# Patient Record
Sex: Female | Born: 1990 | Race: White | Hispanic: No | Marital: Single | Smoking: Never smoker
Health system: Southern US, Community
[De-identification: ages and names within clinical notes are randomized; demographics above are authoritative.]

## PROBLEM LIST (undated history)

## (undated) DIAGNOSIS — F32A Depression, unspecified: Secondary | ICD-10-CM

## (undated) DIAGNOSIS — R569 Unspecified convulsions: Secondary | ICD-10-CM

## (undated) DIAGNOSIS — R079 Chest pain, unspecified: Secondary | ICD-10-CM

## (undated) DIAGNOSIS — J45909 Unspecified asthma, uncomplicated: Secondary | ICD-10-CM

## (undated) DIAGNOSIS — F329 Major depressive disorder, single episode, unspecified: Secondary | ICD-10-CM

## (undated) DIAGNOSIS — A1801 Tuberculosis of spine: Secondary | ICD-10-CM

## (undated) HISTORY — DX: Unspecified asthma, uncomplicated: J45.909

## (undated) HISTORY — DX: Chest pain, unspecified: R07.9

## (undated) HISTORY — PX: TONSILLECTOMY: SUR1361

---
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
## 2009-04-25 ENCOUNTER — Ambulatory Visit: Payer: Self-pay | Admitting: Women's Health

## 2009-12-06 ENCOUNTER — Emergency Department (HOSPITAL_COMMUNITY): Admission: EM | Admit: 2009-12-06 | Discharge: 2009-12-07 | Payer: Self-pay | Admitting: Emergency Medicine

## 2010-08-11 ENCOUNTER — Encounter (HOSPITAL_COMMUNITY): Payer: Self-pay

## 2010-08-11 ENCOUNTER — Emergency Department (HOSPITAL_COMMUNITY): Payer: BC Managed Care – PPO

## 2010-08-11 ENCOUNTER — Emergency Department (HOSPITAL_COMMUNITY)
Admission: EM | Admit: 2010-08-11 | Discharge: 2010-08-11 | Disposition: A | Discharge status: Home / Self Care | Payer: BC Managed Care – PPO | Attending: Emergency Medicine | Admitting: Emergency Medicine

## 2010-08-11 DIAGNOSIS — J45901 Unspecified asthma with (acute) exacerbation: Secondary | ICD-10-CM | POA: Insufficient documentation

## 2010-12-02 ENCOUNTER — Emergency Department (HOSPITAL_COMMUNITY): Payer: BC Managed Care – PPO

## 2010-12-02 ENCOUNTER — Emergency Department (HOSPITAL_COMMUNITY)
Admission: EM | Admit: 2010-12-02 | Discharge: 2010-12-02 | Disposition: A | Discharge status: Home / Self Care | Payer: BC Managed Care – PPO | Attending: Emergency Medicine | Admitting: Emergency Medicine

## 2010-12-02 ENCOUNTER — Encounter (HOSPITAL_COMMUNITY): Payer: Self-pay | Admitting: Radiology

## 2010-12-02 DIAGNOSIS — R569 Unspecified convulsions: Secondary | ICD-10-CM | POA: Insufficient documentation

## 2010-12-02 DIAGNOSIS — J45909 Unspecified asthma, uncomplicated: Secondary | ICD-10-CM | POA: Insufficient documentation

## 2010-12-02 DIAGNOSIS — N390 Urinary tract infection, site not specified: Secondary | ICD-10-CM | POA: Insufficient documentation

## 2010-12-02 DIAGNOSIS — R5381 Other malaise: Secondary | ICD-10-CM | POA: Insufficient documentation

## 2010-12-02 DIAGNOSIS — R51 Headache: Secondary | ICD-10-CM | POA: Insufficient documentation

## 2010-12-02 DIAGNOSIS — F29 Unspecified psychosis not due to a substance or known physiological condition: Secondary | ICD-10-CM | POA: Insufficient documentation

## 2010-12-02 DIAGNOSIS — R110 Nausea: Secondary | ICD-10-CM | POA: Insufficient documentation

## 2010-12-02 LAB — CBC
HCT: 35.9 % — ABNORMAL LOW (ref 36.0–46.0)
Hemoglobin: 12.9 g/dL (ref 12.0–15.0)
MCHC: 35.9 g/dL (ref 30.0–36.0)
MCV: 83.7 fL (ref 78.0–100.0)
Platelets: 214 10*3/uL (ref 150–400)
RBC: 4.29 MIL/uL (ref 3.87–5.11)
RDW: 12.2 % (ref 11.5–15.5)
WBC: 5.5 10*3/uL (ref 4.0–10.5)

## 2010-12-02 LAB — COMPREHENSIVE METABOLIC PANEL
ALT: 11 U/L (ref 0–35)
AST: 17 U/L (ref 0–37)
Alkaline Phosphatase: 42 U/L (ref 39–117)
BUN: 10 mg/dL (ref 6–23)
CO2: 29 mEq/L (ref 19–32)
Chloride: 102 mEq/L (ref 96–112)
Creatinine, Ser: 0.83 mg/dL (ref 0.50–1.10)
GFR calc Af Amer: >90 mL/min (ref >90)
GFR calc non Af Amer: >90 mL/min (ref >90)
Glucose, Bld: 111 mg/dL — ABNORMAL HIGH (ref 70–99)
Potassium: 3.4 mEq/L — ABNORMAL LOW (ref 3.5–5.1)
Sodium: 138 mEq/L (ref 135–145)
Total Bilirubin: 0.6 mg/dL (ref 0.3–1.2)
Total Protein: 7.3 g/dL (ref 6.0–8.3)

## 2010-12-02 LAB — URINALYSIS, ROUTINE W REFLEX MICROSCOPIC
Bilirubin Urine: NEGATIVE
Glucose, UA: NEGATIVE mg/dL
Ketones, ur: NEGATIVE mg/dL
Nitrite: NEGATIVE
Specific Gravity, Urine: 1.008 (ref 1.005–1.030)
Urobilinogen, UA: 0.2 mg/dL (ref 0.0–1.0)
pH: 7 (ref 5.0–8.0)

## 2010-12-02 LAB — DIFFERENTIAL
Basophils Absolute: 0 10*3/uL (ref 0.0–0.1)
Basophils Relative: 1 % (ref 0–1)
Eosinophils Absolute: 0.1 10*3/uL (ref 0.0–0.7)
Lymphs Abs: 1.3 10*3/uL (ref 0.7–4.0)
Monocytes Absolute: 0.4 10*3/uL (ref 0.1–1.0)
Monocytes Relative: 7 % (ref 3–12)
Neutrophils Relative %: 67 % (ref 43–77)

## 2010-12-02 LAB — RAPID URINE DRUG SCREEN, HOSP PERFORMED
Amphetamines: NOT DETECTED
Benzodiazepines: NOT DETECTED
Cocaine: NOT DETECTED
Opiates: NOT DETECTED
Tetrahydrocannabinol: NOT DETECTED

## 2010-12-02 LAB — URINE MICROSCOPIC-ADD ON

## 2010-12-03 LAB — URINE CULTURE
Colony Count: 70000
Culture  Setup Time: 201210022016

## 2010-12-04 ENCOUNTER — Emergency Department (HOSPITAL_COMMUNITY)
Admission: EM | Admit: 2010-12-04 | Discharge: 2010-12-04 | Disposition: A | Discharge status: Home / Self Care | Payer: BC Managed Care – PPO | Attending: Emergency Medicine | Admitting: Emergency Medicine

## 2010-12-04 DIAGNOSIS — J45909 Unspecified asthma, uncomplicated: Secondary | ICD-10-CM | POA: Insufficient documentation

## 2010-12-04 DIAGNOSIS — F29 Unspecified psychosis not due to a substance or known physiological condition: Secondary | ICD-10-CM | POA: Insufficient documentation

## 2010-12-04 DIAGNOSIS — R569 Unspecified convulsions: Secondary | ICD-10-CM | POA: Insufficient documentation

## 2010-12-06 ENCOUNTER — Emergency Department (HOSPITAL_COMMUNITY)
Admission: EM | Admit: 2010-12-06 | Discharge: 2010-12-06 | Disposition: A | Discharge status: Home / Self Care | Payer: BC Managed Care – PPO | Attending: Emergency Medicine | Admitting: Emergency Medicine

## 2010-12-06 DIAGNOSIS — R569 Unspecified convulsions: Secondary | ICD-10-CM | POA: Insufficient documentation

## 2010-12-06 DIAGNOSIS — R51 Headache: Secondary | ICD-10-CM | POA: Insufficient documentation

## 2010-12-06 DIAGNOSIS — J45909 Unspecified asthma, uncomplicated: Secondary | ICD-10-CM | POA: Insufficient documentation

## 2010-12-30 ENCOUNTER — Emergency Department (HOSPITAL_COMMUNITY)
Admission: EM | Admit: 2010-12-30 | Discharge: 2010-12-30 | Disposition: A | Discharge status: Home / Self Care | Payer: BC Managed Care – PPO | Attending: Emergency Medicine | Admitting: Emergency Medicine

## 2010-12-30 DIAGNOSIS — Z79899 Other long term (current) drug therapy: Secondary | ICD-10-CM | POA: Insufficient documentation

## 2010-12-30 DIAGNOSIS — J45909 Unspecified asthma, uncomplicated: Secondary | ICD-10-CM | POA: Insufficient documentation

## 2010-12-30 DIAGNOSIS — G40909 Epilepsy, unspecified, not intractable, without status epilepticus: Secondary | ICD-10-CM | POA: Insufficient documentation

## 2010-12-30 DIAGNOSIS — R443 Hallucinations, unspecified: Secondary | ICD-10-CM | POA: Insufficient documentation

## 2011-10-31 ENCOUNTER — Encounter (HOSPITAL_COMMUNITY): Payer: Self-pay | Admitting: Emergency Medicine

## 2011-10-31 ENCOUNTER — Observation Stay (HOSPITAL_COMMUNITY)
Admission: EM | Admit: 2011-10-31 | Discharge: 2011-11-03 | DRG: 065 | Disposition: A | Discharge status: Home / Self Care | Payer: BC Managed Care – PPO | Attending: Internal Medicine | Admitting: Internal Medicine

## 2011-10-31 DIAGNOSIS — G40909 Epilepsy, unspecified, not intractable, without status epilepticus: Secondary | ICD-10-CM | POA: Diagnosis present

## 2011-10-31 DIAGNOSIS — R42 Dizziness and giddiness: Secondary | ICD-10-CM | POA: Diagnosis present

## 2011-10-31 DIAGNOSIS — R82998 Other abnormal findings in urine: Secondary | ICD-10-CM | POA: Insufficient documentation

## 2011-10-31 DIAGNOSIS — R270 Ataxia, unspecified: Secondary | ICD-10-CM

## 2011-10-31 DIAGNOSIS — Z7982 Long term (current) use of aspirin: Secondary | ICD-10-CM | POA: Insufficient documentation

## 2011-10-31 DIAGNOSIS — H8309 Labyrinthitis, unspecified ear: Principal | ICD-10-CM | POA: Diagnosis present

## 2011-10-31 DIAGNOSIS — R112 Nausea with vomiting, unspecified: Secondary | ICD-10-CM | POA: Diagnosis present

## 2011-10-31 DIAGNOSIS — R109 Unspecified abdominal pain: Secondary | ICD-10-CM | POA: Insufficient documentation

## 2011-10-31 DIAGNOSIS — J45909 Unspecified asthma, uncomplicated: Secondary | ICD-10-CM | POA: Diagnosis present

## 2011-10-31 HISTORY — DX: Unspecified convulsions: R56.9

## 2011-10-31 MED ORDER — ONDANSETRON HCL 4 MG/2ML IJ SOLN
INTRAMUSCULAR | Status: AC
  Administered 2011-10-31: 23:00:00
  Filled 2011-10-31: qty 2

## 2011-10-31 NOTE — ED Notes (Signed)
Pr arrived by ems. Pt reports, Abdominal pain for 4 days. nv x 2days. Was seen by pcp and prescribed zofran. Has not been helping. Pt with hx of seizure. Family member reports focal seizure like activity. Pt had syncopal episode lasting a few seconds when ems arrived. Pt. A&o. 4mg  zofran by EMS. 20 G L AC.

## 2011-11-01 ENCOUNTER — Emergency Department (HOSPITAL_COMMUNITY): Payer: BC Managed Care – PPO

## 2011-11-01 ENCOUNTER — Inpatient Hospital Stay (HOSPITAL_COMMUNITY): Payer: BC Managed Care – PPO

## 2011-11-01 ENCOUNTER — Encounter (HOSPITAL_COMMUNITY): Payer: Self-pay | Admitting: Internal Medicine

## 2011-11-01 DIAGNOSIS — H8309 Labyrinthitis, unspecified ear: Secondary | ICD-10-CM

## 2011-11-01 DIAGNOSIS — J45909 Unspecified asthma, uncomplicated: Secondary | ICD-10-CM | POA: Diagnosis present

## 2011-11-01 DIAGNOSIS — G40909 Epilepsy, unspecified, not intractable, without status epilepticus: Secondary | ICD-10-CM | POA: Diagnosis present

## 2011-11-01 DIAGNOSIS — R42 Dizziness and giddiness: Secondary | ICD-10-CM

## 2011-11-01 LAB — CBC WITH DIFFERENTIAL/PLATELET
Basophils Absolute: 0 10*3/uL (ref 0.0–0.1)
Eosinophils Absolute: 0 10*3/uL (ref 0.0–0.7)
Eosinophils Relative: 0 % (ref 0–5)
Lymphocytes Relative: 14 % (ref 12–46)
Lymphs Abs: 0.7 10*3/uL (ref 0.7–4.0)
MCV: 85.9 fL (ref 78.0–100.0)
Neutrophils Relative %: 81 % — ABNORMAL HIGH (ref 43–77)
Platelets: 159 10*3/uL (ref 150–400)
RBC: 3.68 MIL/uL — ABNORMAL LOW (ref 3.87–5.11)
RDW: 12.2 % (ref 11.5–15.5)
WBC: 5.2 10*3/uL (ref 4.0–10.5)

## 2011-11-01 LAB — COMPREHENSIVE METABOLIC PANEL
ALT: 10 U/L (ref 0–35)
Alkaline Phosphatase: 35 U/L — ABNORMAL LOW (ref 39–117)
BUN: 8 mg/dL (ref 6–23)
CO2: 28 mEq/L (ref 19–32)
Chloride: 107 mEq/L (ref 96–112)
GFR calc Af Amer: >90 mL/min (ref >90)
GFR calc non Af Amer: >90 mL/min (ref >90)
Glucose, Bld: 86 mg/dL (ref 70–99)
Potassium: 3.4 mEq/L — ABNORMAL LOW (ref 3.5–5.1)
Sodium: 140 mEq/L (ref 135–145)
Total Bilirubin: 0.3 mg/dL (ref 0.3–1.2)

## 2011-11-01 LAB — URINALYSIS, ROUTINE W REFLEX MICROSCOPIC
Ketones, ur: NEGATIVE mg/dL
Nitrite: NEGATIVE
Protein, ur: NEGATIVE mg/dL
pH: 6 (ref 5.0–8.0)

## 2011-11-01 LAB — CARBAMAZEPINE LEVEL, TOTAL: Carbamazepine Lvl: 1.3 ug/mL — ABNORMAL LOW (ref 4.0–12.0)

## 2011-11-01 LAB — BASIC METABOLIC PANEL
CO2: 24 mEq/L (ref 19–32)
Calcium: 8.3 mg/dL — ABNORMAL LOW (ref 8.4–10.5)
Creatinine, Ser: 0.77 mg/dL (ref 0.50–1.10)
GFR calc non Af Amer: >90 mL/min (ref >90)
Sodium: 137 mEq/L (ref 135–145)

## 2011-11-01 LAB — POCT PREGNANCY, URINE: Preg Test, Ur: NEGATIVE

## 2011-11-01 MED ORDER — OXCARBAZEPINE 300 MG PO TABS
600.0000 mg | ORAL_TABLET | ORAL | Status: AC
  Administered 2011-11-01: 600 mg via ORAL
  Filled 2011-11-01: qty 2

## 2011-11-01 MED ORDER — ACETAMINOPHEN 325 MG PO TABS
650.0000 mg | ORAL_TABLET | Freq: Four times a day (QID) | ORAL | Status: DC | PRN
  Administered 2011-11-02: 650 mg via ORAL
  Filled 2011-11-01: qty 1

## 2011-11-01 MED ORDER — MONTELUKAST SODIUM 10 MG PO TABS
10.0000 mg | ORAL_TABLET | Freq: Every day | ORAL | Status: DC
  Administered 2011-11-01 – 2011-11-02 (×2): 10 mg via ORAL
  Filled 2011-11-01 (×3): qty 1

## 2011-11-01 MED ORDER — OXCARBAZEPINE 300 MG PO TABS
600.0000 mg | ORAL_TABLET | Freq: Every morning | ORAL | Status: DC
  Administered 2011-11-02 – 2011-11-03 (×2): 600 mg via ORAL
  Filled 2011-11-01 (×2): qty 2

## 2011-11-01 MED ORDER — ONDANSETRON HCL 4 MG/2ML IJ SOLN
4.0000 mg | Freq: Four times a day (QID) | INTRAMUSCULAR | Status: DC | PRN
  Administered 2011-11-02: 4 mg via INTRAVENOUS
  Filled 2011-11-01: qty 2

## 2011-11-01 MED ORDER — DIAZEPAM 5 MG PO TABS
5.0000 mg | ORAL_TABLET | Freq: Three times a day (TID) | ORAL | Status: DC
  Administered 2011-11-01 – 2011-11-03 (×6): 5 mg via ORAL
  Filled 2011-11-01 (×6): qty 1

## 2011-11-01 MED ORDER — PROMETHAZINE HCL 25 MG/ML IJ SOLN: 12.5000 mg | Freq: Four times a day (QID) | INTRAMUSCULAR | Status: DC | PRN

## 2011-11-01 MED ORDER — POTASSIUM CHLORIDE CRYS ER 20 MEQ PO TBCR
40.0000 meq | EXTENDED_RELEASE_TABLET | Freq: Once | ORAL | Status: AC
  Administered 2011-11-01: 40 meq via ORAL
  Filled 2011-11-01: qty 2

## 2011-11-01 MED ORDER — ALBUTEROL SULFATE (5 MG/ML) 0.5% IN NEBU: 2.5000 mg | INHALATION_SOLUTION | RESPIRATORY_TRACT | Status: DC | PRN

## 2011-11-01 MED ORDER — ONDANSETRON HCL 4 MG PO TABS: 4.0000 mg | ORAL_TABLET | Freq: Four times a day (QID) | ORAL | Status: DC | PRN

## 2011-11-01 MED ORDER — PROMETHAZINE HCL 25 MG/ML IJ SOLN
25.0000 mg | Freq: Once | INTRAMUSCULAR | Status: AC
  Administered 2011-11-01: 25 mg via INTRAVENOUS
  Filled 2011-11-01: qty 1

## 2011-11-01 MED ORDER — GADOBENATE DIMEGLUMINE 529 MG/ML IV SOLN
10.0000 mL | Freq: Once | INTRAVENOUS | Status: AC | PRN
  Administered 2011-11-01: 10 mL via INTRAVENOUS

## 2011-11-01 MED ORDER — ACETAMINOPHEN 650 MG RE SUPP: 650.0000 mg | Freq: Four times a day (QID) | RECTAL | Status: DC | PRN

## 2011-11-01 MED ORDER — ALBUTEROL SULFATE HFA 108 (90 BASE) MCG/ACT IN AERS
2.0000 | INHALATION_SPRAY | Freq: Four times a day (QID) | RESPIRATORY_TRACT | Status: DC | PRN
  Filled 2011-11-01: qty 6.7

## 2011-11-01 MED ORDER — MECLIZINE HCL 25 MG PO TABS
25.0000 mg | ORAL_TABLET | Freq: Three times a day (TID) | ORAL | Status: DC
  Administered 2011-11-01 – 2011-11-03 (×6): 25 mg via ORAL
  Filled 2011-11-01 (×8): qty 1

## 2011-11-01 MED ORDER — ENOXAPARIN SODIUM 40 MG/0.4ML ~~LOC~~ SOLN
40.0000 mg | SUBCUTANEOUS | Status: DC
  Administered 2011-11-01 – 2011-11-02 (×2): 40 mg via SUBCUTANEOUS
  Filled 2011-11-01 (×3): qty 0.4

## 2011-11-01 MED ORDER — OXCARBAZEPINE 300 MG PO TABS
750.0000 mg | ORAL_TABLET | Freq: Every day | ORAL | Status: DC
  Administered 2011-11-01 – 2011-11-02 (×2): 750 mg via ORAL
  Filled 2011-11-01 (×3): qty 1

## 2011-11-01 MED ORDER — SODIUM CHLORIDE 0.9 % IV SOLN
1000.0000 mL | INTRAVENOUS | Status: DC
  Administered 2011-11-01 (×2): 1000 mL via INTRAVENOUS

## 2011-11-01 MED ORDER — DIPHENHYDRAMINE HCL 50 MG/ML IJ SOLN
25.0000 mg | Freq: Once | INTRAMUSCULAR | Status: AC
  Administered 2011-11-01: 25 mg via INTRAVENOUS
  Filled 2011-11-01 (×2): qty 1

## 2011-11-01 MED ORDER — SODIUM CHLORIDE 0.9 % IV SOLN
INTRAVENOUS | Status: DC
  Administered 2011-11-01: 75 mL via INTRAVENOUS
  Administered 2011-11-02: 17:00:00 via INTRAVENOUS

## 2011-11-01 NOTE — ED Notes (Signed)
Pt returned from CT. Amb to BR without problem. Pt denies problem at this time. Family remains at this time

## 2011-11-01 NOTE — ED Notes (Signed)
Patient transported to MRI 

## 2011-11-01 NOTE — ED Provider Notes (Addendum)
History     CSN: [id number]  Arrival date & time 10/31/11  2215   First MD Initiated Contact with Patient 11/01/11 0133      Chief Complaint  Patient presents with  . Abdominal Pain    (Consider location/radiation/quality/duration/timing/severity/associated sxs/prior treatment) The history is provided by a parent and the patient.   21 year old, female, with a history of epilepsy, and asthma, presents emergency department complaining of headache, nausea, vomiting, ringling in her left ear, as well as abdominal pain.  Her symptoms have been present for the past.  4 days.  Her headache began before.  The abdominal pain, and vomiting.  Since she has had the vomiting.  She has not been able to keep her medications down She denies fevers, chills, cough, or shortness of breath.  She denies urinary tract symptoms.  She had had diarrhea.  A few days ago.  She was seen in another emergency department yesterday for the same symptoms where she had a CAT scan apparently, was normal.  She takes Trileptal for her seizures.  She has not changed the dose in greater than 4 months.  She also takes an over-the-counter antihistamine for her asthma, which she has not changed the dosage on.  She uses albuterol, and Singulair as needed.  The last time.  She used them was 4 days ago.  She states that her nausea and vomiting is worse.  If she opened her eyes.  Presently, she denies any symptoms.  Past Medical History  Diagnosis Date  . Asthma   . Seizures     History reviewed. No pertinent past surgical history.  No family history on file.  History  Substance Use Topics  . Smoking status: Not on file  . Smokeless tobacco: Not on file  . Alcohol Use:     OB History    Grav Para Term Preterm Abortions TAB SAB Ect Mult Living                  Review of Systems  Constitutional: Negative for fever, chills and diaphoresis.  HENT: Negative for neck pain.   Eyes: Positive for visual disturbance.    Respiratory: Negative for cough and shortness of breath.   Cardiovascular: Negative for chest pain.  Gastrointestinal: Positive for nausea, vomiting, abdominal pain and diarrhea.  Genitourinary: Negative for dysuria.  Musculoskeletal: Negative for back pain.  Skin: Negative for rash.  Neurological: Positive for dizziness, seizures and headaches.  Hematological: Does not bruise/bleed easily.  Psychiatric/Behavioral: Negative for confusion.  All other systems reviewed and are negative.    Allergies  Amoxicillin  Home Medications   Current Outpatient Rx  Name Route Sig Dispense Refill  . ALBUTEROL SULFATE (2.5 MG/3ML) 0.083% IN NEBU Nebulization Take 2.5 mg by nebulization every 6 (six) hours as needed. For shortness of breath    . ASPIRIN EC 325 MG PO TBEC Oral Take 325 mg by mouth every 6 (six) hours as needed. For headache    . KLS ALLER-TEC PO Oral Take 1 tablet by mouth daily.    Marland Kitchen OXCARBAZEPINE 300 MG PO TABS Oral Take 600-750 mg by mouth See admin instructions. Takes 2 tablets in the morning, and 2.5 tablets at night.    . ALBUTEROL SULFATE HFA 108 (90 BASE) MCG/ACT IN AERS Inhalation Inhale 2 puffs into the lungs every 6 (six) hours as needed. For asthma symptoms    . MONTELUKAST SODIUM 10 MG PO TABS Oral Take 10 mg by mouth at bedtime.  BP 134/80  Pulse 96  Temp 97 F (36.1 C)  Resp 18  SpO2 98%  Physical Exam  Nursing note and vitals reviewed. Constitutional: She is oriented to person, place, and time. She appears well-developed and well-nourished. No distress.  HENT:  Head: Normocephalic and atraumatic.  Eyes: Conjunctivae and EOM are normal. Pupils are equal, round, and reactive to light.  Neck: Normal range of motion. Neck supple.  Cardiovascular: Normal rate, regular rhythm and intact distal pulses.   No murmur heard. Pulmonary/Chest: Effort normal and breath sounds normal. She has no rales.  Abdominal: Soft. She exhibits no distension. There is  tenderness. There is no rebound and no guarding.       Mild tenderness in the right upper quadrant without peritoneal signs  Musculoskeletal: Normal range of motion. She exhibits no edema.  Neurological: She is alert and oriented to person, place, and time. She has normal strength. No cranial nerve deficit. Coordination normal.       Wide based gait with ataxia and + rhomberg  Skin: Skin is warm and dry.  Psychiatric: She has a normal mood and affect. Thought content normal.    ED Course  Procedures (including critical care time) Headache, nausea, vomiting, and tenderness along with ataxia in a 21 year old, female, with known epilepsy.  Concerns are Trileptal, intoxication versus new brain lesion. I discussed the case with Dr. Thana Farr, the neurologist.  She's suggested performing a Tegretol level before proceeding with further imaging.   Labs Reviewed  SPECIMEN HOLD   No results found.   No diagnosis found.  5:23 AM Spoke with Dr. Thad Ranger again.   Told her CBZ level.  She rec'd MRI since ataxic with low CBZ level.  She said if MRI nl, stop all meds except trileptal and f/u with her neurologist.  MDM  Headache, with nausea, vomiting, and ataxia        Cheri Guppy, MD 11/01/11 1610  Cheri Guppy, MD 11/01/11 [phone]

## 2011-11-01 NOTE — ED Notes (Signed)
Family at bedside. meds given per order. Pt awakens easily. Has flat affect. States she may need her inhaler later today

## 2011-11-01 NOTE — ED Notes (Signed)
Family at bedside.pt resting quielty

## 2011-11-01 NOTE — ED Notes (Signed)
MD to bedside to talk with family. Pt sleeping

## 2011-11-01 NOTE — ED Notes (Signed)
Pt sitting up and eating crackers. Family at bedside. VSS pt denies pain or nausea at present

## 2011-11-01 NOTE — ED Notes (Signed)
Pt in CT.

## 2011-11-01 NOTE — Consult Note (Signed)
Reason for Consult: Vertigo, Nystagmus, Nausea and vomiting Referring Physician:  Dr. Osvaldo Shipper  Charts, medications, labs and images reviewed CC: Vertigo, Nystagmus, Nausea and vomiting  HPI: This is a 21 y/o RHWF with a diagnosis of  Partial Seizures and has been seen and followed by Dr. Tera Helper  (Neurologist)  At [facility]  And her seizures have been controlled on trileptal. According to the patient she has been feeling nauseated, and symptoms of vertigo with tinnitus, but she denies break thru seizures. She is not sure if she had any blank stares. But there is no tongue biting, no episodes of convulsions, no bladder or bowel incontinent No headaches, no neck pain, no fever, no motor or sensory deficits, no malaise.She also complains of fullness in her ears. She has received a dose of Benadryl in the morning and then she received Valium and she mentions her symptoms have resolved significantly. Now she can open her eyes and focus and participate in a conversation.  No hearing deficits, negative for lhermitte sign, negative for nuchal rigidity, no rash, patient is not on OCP. She is not taking any other OTC medications. She has constipation on regular basis, she is not on Aminoglycoside For acne.  Past Medical History  Diagnosis Date  . Asthma   . Seizures     . Depression  History reviewed. No pertinent past surgical history.  Family History  Problem Relation Age of Onset  . Thyroid disease Brother     Social History:  reports that she has never smoked. She does not have any smokeless tobacco history on file. She reports that she drinks alcohol. She reports that she does not use illicit drugs.  Allergies  Allergen Reactions  . Amoxicillin Rash    Medications:  Scheduled:   . diazepam  5 mg Oral Q8H  . diphenhydrAMINE  25 mg Intravenous Once  . enoxaparin (LOVENOX) injection  40 mg Subcutaneous Q24H  . meclizine  25 mg Oral TID  . montelukast  10 mg Oral QHS  .  ondansetron      . OXcarbazepine  600 mg Oral To ER  . OXcarbazepine  600 mg Oral q morning - 10a  . OXcarbazepine  750 mg Oral QHS  . potassium chloride  40 mEq Oral Once  . promethazine  25 mg Intravenous Once    ROS:  All 11 point review of systems were obtained and pertinent ones are mentioned in the HPI  Physical Examination: Blood pressure 129/80, pulse 76, temperature 98.9 F (37.2 C), resp. rate 18, height 5\' 4"  (1.626 m), weight 49.896 kg (110 lb), SpO2 100.00%.  Physical Exam: General: She is hypersomnolence from valium but was able to follow commands HEENT: NO nystagmus, no ptosis, no lid lag, PEERLA, EOMI, no field cuts, no neglect Neck: Supple, no JVD, no Carotid bruit Lungs: CTA Heart: RRR, no murmurs, rubs and or gallops Abdomen: bowel sounds present Skin; nO rash, no vesicles Extremities: No edema   Neurologic Examination Mental Status: Alert, oriented, thought content appropriate.  Speech fluent without evidence of aphasia.  Able to follow 3 step commands without difficulty. Cranial Nerves: II: visual fields grossly normal, pupils equal, round, reactive to light and accommodation III,IV, VI: ptosis not present, extra-ocular motions intact bilaterally V,VII: smile symmetric, facial light touch sensation normal bilaterally VIII: hearing normal bilaterally IX,X: gag reflex present XI: trapezius strength/neck flexion strength normal bilaterally XII: tongue strength normal  Motor: Right : Upper extremity   5/5    Left:  Upper extremity   5/5  Lower extremity   5/5     Lower extremity   5/5 Tone and bulk:normal tone throughout; no atrophy noted Sensory: Pinprick and light touch intact throughout, bilaterally Deep Tendon Reflexes: 2+ and symmetric throughout Plantars: Right: downgoing   Left: downgoing Cerebellar: normal finger-to-nose, normal rapid alternating movements and normal heel-to-shin test Extrapyramidal: negative     Laboratory Studies:     Basic Metabolic Panel:  Lab 11/01/11 7846 11/01/11 0241  NA 140 137  K 3.4* 3.4*  CL 107 105  CO2 28 24  GLUCOSE 86 107*  BUN 8 11  CREATININE 0.88 0.77  CALCIUM 9.0 8.3*  MG -- --  PHOS -- --    Liver Function Tests:  Lab 11/01/11 1229  AST 14  ALT 10  ALKPHOS 35*  BILITOT 0.3  PROT 6.2  ALBUMIN 3.6    Lab 11/01/11 1229  LIPASE 22  AMYLASE --   No results found for this basename: AMMONIA:3 in the last 168 hours  CBC:  Lab 11/01/11 0241  WBC 5.2  NEUTROABS 4.2  HGB 11.0*  HCT 31.6*  MCV 85.9  PLT 159    Cardiac Enzymes: No results found for this basename: CKTOTAL:5,CKMB:5,CKMBINDEX:5,TROPONINI:5 in the last 168 hours  BNP: No components found with this basename: POCBNP:5  CBG: No results found for this basename: GLUCAP:5 in the last 168 hours  Microbiology: Results for orders placed during the hospital encounter of 12/02/10  URINE CULTURE     Status: Normal   Collection Time   12/02/10 11:10 AM      Component Value Range Status Comment   Specimen Description URINE, RANDOM   Final    Special Requests NONE   Final    Culture  Setup Time [id number]   Final    Colony Count 70,000 COLONIES/ML   Final    Culture     Final    Value: Multiple bacterial morphotypes present, none predominant. Suggest appropriate recollection if clinically indicated.   Report Status 12/03/2010 FINAL   Final     Coagulation Studies: No results found for this basename: LABPROT:5,INR:5 in the last 72 hours  Urinalysis:  Lab 11/01/11 0241  COLORURINE YELLOW  LABSPEC 1.025  PHURINE 6.0  GLUCOSEU NEGATIVE  HGBUR SMALL*  BILIRUBINUR NEGATIVE  KETONESUR NEGATIVE  PROTEINUR NEGATIVE  UROBILINOGEN 0.2  NITRITE NEGATIVE  LEUKOCYTESUR TRACE*    Lipid Panel:  No results found for this basename: chol, trig, hdl, cholhdl, vldl, ldlcalc    HgbA1C:  No results found for this basename: HGBA1C    Urine Drug Screen:     Component Value Date/Time   LABOPIA NONE  DETECTED 12/02/2010 1110   COCAINSCRNUR NONE DETECTED 12/02/2010 1110   LABBENZ NONE DETECTED 12/02/2010 1110   AMPHETMU NONE DETECTED 12/02/2010 1110   THCU NONE DETECTED 12/02/2010 1110   LABBARB NONE DETECTED 12/02/2010 1110    Alcohol Level: No results found for this basename: ETH:2 in the last 168 hours  Other results: EKG: normal EKG, normal sinus rhythm, there are no previous tracings available for comparison.  Imaging: Mr Shirlee Latch NG Contrast  11/01/2011  *RADIOLOGY REPORT*  Clinical Data:  Vertigo.  Seizures.  MRA HEAD WITHOUT CONTRAST  Technique:  Angiographic images of the Circle of Willis were obtained using MRA technique without intravenous contrast.  Comparison:  MRI brain 11/01/2011.  Findings:  The internal carotid arteries are within normal limits from high cervical segments through the ICA termini.  The A1  and M1 segments are normal.  The anterior communicating artery is patent. The ACA and MCA branch vessels are within normal limits bilaterally.  The left vertebral artery is the dominant vessel.  The PICA origins are visualized and within normal limits bilaterally.  The basilar artery is normal.  Both posterior cerebral arteries originate from basilar tip.  The PCA branch vessels are unremarkable.  IMPRESSION: Normal MRA circle of Willis.  No significant proximal stenosis, aneurysm, or branch vessel occlusion.  *RADIOLOGY REPORT*  MRA NECK WITHOUT AND WITH CONTRAST  Technique:  Angiographic images of the neck were obtained using MRA technique without and with intravenous contrast.  Carotid stenosis measurements (when applicable) are obtained utilizing NASCET criteria, using the distal internal carotid diameter as the denominator.  Contrast: 10mL MULTIHANCE GADOBENATE DIMEGLUMINE 529 MG/ML IV SOLN  Findings:  The time-of-flight images demonstrate no significant flow disturbance at either carotid bifurcation.  Flow is antegrade in the vertebral arteries bilaterally.  A standard three-vessel  arch configuration is present.  The vertebral arteries originate from the subclavian arteries bilaterally.  The left vertebral artery is dominant.  There is no significant stenosis.  The common carotid arteries and carotid artery bifurcations are within normal limits bilaterally.  The internal carotid arteries are straight and normal bilaterally.  IMPRESSION: Negative MRA of the neck.   Original Report Authenticated By: Jamesetta Orleans. MATTERN, M.D.    Mr Angiogram Neck W Wo Contrast  11/01/2011  *RADIOLOGY REPORT*  Clinical Data:  Vertigo.  Seizures.  MRA HEAD WITHOUT CONTRAST  Technique:  Angiographic images of the Circle of Willis were obtained using MRA technique without intravenous contrast.  Comparison:  MRI brain 11/01/2011.  Findings:  The internal carotid arteries are within normal limits from high cervical segments through the ICA termini.  The A1 and M1 segments are normal.  The anterior communicating artery is patent. The ACA and MCA branch vessels are within normal limits bilaterally.  The left vertebral artery is the dominant vessel.  The PICA origins are visualized and within normal limits bilaterally.  The basilar artery is normal.  Both posterior cerebral arteries originate from basilar tip.  The PCA branch vessels are unremarkable.  IMPRESSION: Normal MRA circle of Willis.  No significant proximal stenosis, aneurysm, or branch vessel occlusion.  *RADIOLOGY REPORT*  MRA NECK WITHOUT AND WITH CONTRAST  Technique:  Angiographic images of the neck were obtained using MRA technique without and with intravenous contrast.  Carotid stenosis measurements (when applicable) are obtained utilizing NASCET criteria, using the distal internal carotid diameter as the denominator.  Contrast: 10mL MULTIHANCE GADOBENATE DIMEGLUMINE 529 MG/ML IV SOLN  Findings:  The time-of-flight images demonstrate no significant flow disturbance at either carotid bifurcation.  Flow is antegrade in the vertebral arteries  bilaterally.  A standard three-vessel arch configuration is present.  The vertebral arteries originate from the subclavian arteries bilaterally.  The left vertebral artery is dominant.  There is no significant stenosis.  The common carotid arteries and carotid artery bifurcations are within normal limits bilaterally.  The internal carotid arteries are straight and normal bilaterally.  IMPRESSION: Negative MRA of the neck.   Original Report Authenticated By: Jamesetta Orleans. MATTERN, M.D.    Mr Brain Wo Contrast  11/01/2011  *RADIOLOGY REPORT*  Clinical Data: Ataxia.  History of epilepsy.  Headache.  Nausea and vomiting.  Ringing in the left ear.  MRI HEAD WITHOUT CONTRAST  Technique:  Multiplanar, multiecho pulse sequences of the brain and surrounding structures were obtained according to  standard protocol without intravenous contrast.  Comparison: CT head without contrast 12/02/2010.  Findings: No acute intracranial abnormalities are present. Specifically, there is no evidence for acute infarct, hemorrhage, mass, hydrocephalus, or significant extra-axial fluid collection. Flow is present in the major intracranial arteries.  The globes and orbits are intact.  The paranasal sinuses and mastoid air cells are clear.  IMPRESSION: Negative MRI of the brain.   Original Report Authenticated By: Jamesetta Orleans. MATTERN, M.D.      Assessment/Plan:  21 y/o WF with history of partial seizures, depression and asthma comes with N/V nystagmus, tinnitus, fullness in the ears, diplopia.   MRI of the brain is negative for any etiology, MRA of the head and neck are negative for vascular disease. Sodium level  is normal, generally hyponatremia is a side effect from trileptal . CBZ levels  Differential diagnosis  1) Labrynthitis 2)  Neuritis 3) BPPV 4) Drug toxicity   Recommendations: 1) benadryl 25 mg IV at bedtime 2) Valium 5 mg daily for  3 days 3) Fluid intake 4) OT consult  Ahmari Duerson V-P Eilleen Kempf., MD.,  Ph.D.,MS 11/01/2011 6:17 PM

## 2011-11-01 NOTE — H&P (Signed)
[name] [name] is an 21 y.o. female.    PCP: She tells me, that she sees a Dr. Orvan Falconer in [place] She sees a Dr. Tera Helper, who is a neurologist at [facility] for her Seizures  Chief Complaint: nausea vomiting, dizziness  HPI: this is a 21 year old, Caucasian female, with a past medical history of seizure disorder, which was diagnosed one year ago. She also has a history of asthma. She presented to the hospital with complaints of abdominal pain, nausea, vomiting, and dizziness, which started on Wednesday. She tells me, that she initially started having nausea and vomiting and on Thursday morning she started having dizziness, which she describes as if the room was spinning around her. She, says she's passed out a few times because of this. She has had some chills but denies any fever. She's never had similar symptoms in the past. Denies any recent medication changes. She's been having some pain in the left ear for the last few days. Also admits to a ringing sensation in the ear. Denies any ear discharge. The dizziness is worse with movement. And that exacerbates her nausea and vomiting. She just finished her menstrual period. She denies being on oral contraceptives. She's not any in any sexual relationship at this time. After receiving multiple medications in the ED, patient is feeling a little better. She is accompanied by her mother, her father, her stepfather and her siblings.  When I was outside the room her stepfather came up to me and told me that the patient tends to develop some medical issues or other when the family goes on vacation without her. Apparently, this has happened many times in the past. Her siblings and parents were out to the beach last week when she had the onset of the symptoms.   Home Medications: Prior to Admission medications   Medication Sig Start Date End Date Taking? Authorizing Provider  albuterol (PROVENTIL) (2.5 MG/3ML) 0.083% nebulizer solution Take 2.5 mg by  nebulization every 6 (six) hours as needed. For shortness of breath   Yes Historical Provider, MD  aspirin EC 325 MG tablet Take 325 mg by mouth every 6 (six) hours as needed. For headache   Yes Historical Provider, MD  Cetirizine HCl (KLS ALLER-TEC PO) Take 1 tablet by mouth daily.   Yes Historical Provider, MD  Oxcarbazepine (TRILEPTAL) 300 MG tablet Take 600-750 mg by mouth See admin instructions. Takes 2 tablets in the morning, and 2.5 tablets at night.   Yes Historical Provider, MD  albuterol (PROVENTIL HFA;VENTOLIN HFA) 108 (90 BASE) MCG/ACT inhaler Inhale 2 puffs into the lungs every 6 (six) hours as needed. For asthma symptoms    Historical Provider, MD  montelukast (SINGULAIR) 10 MG tablet Take 10 mg by mouth at bedtime.    Historical Provider, MD    Allergies:  Allergies  Allergen Reactions  . Amoxicillin Rash    Past Medical History: Past Medical History  Diagnosis Date  . Asthma   . Seizures     Denies any surgeries in the past.  Social History:  reports that she has never smoked. She does not have any smokeless tobacco history on file. She reports that she drinks alcohol. She reports that she does not use illicit drugs.She drinks alcoholic beverages very rarely.  Family History:  Family History  Problem Relation Age of Onset  . Thyroid disease Brother     Review of Systems - History obtained from the patient General ROS: positive for  - chills Psychological ROS: negative  Ophthalmic ROS: negative ENT ROS: as in hpi Allergy and Immunology ROS: negative Hematological and Lymphatic ROS: negative Endocrine ROS: negative Respiratory ROS: no cough, shortness of breath, or wheezing Cardiovascular ROS: no chest pain or dyspnea on exertion Gastrointestinal ROS: no abdominal pain, change in bowel habits, or black or bloody stools Genito-Urinary ROS: no dysuria, trouble voiding, or hematuria Musculoskeletal ROS: negative Neurological ROS: as in hpi Dermatological ROS:  negative  Physical Examination Blood pressure 103/64, pulse 72, temperature 97 F (36.1 C), resp. rate 20, SpO2 98.00%.  General appearance: she is sleepy, but easily arousable, cooperative, appears stated age and no distress Head: Normocephalic, without obvious abnormality, atraumatic Eyes: conjunctivae/corneas clear. PERRL, EOM's intact.  Ears: normal TM's and external ear canals both ears. Some cerumen was noted. Throat: lips, mucosa, and tongue normal; teeth and gums normal Neck: no adenopathy, no carotid bruit, no JVD, supple, symmetrical, trachea midline and thyroid not enlarged, symmetric, no tenderness/mass/nodules Resp: clear to auscultation bilaterally Cardio: regular rate and rhythm, S1, S2 normal, no murmur, click, rub or gallop GI: soft, non-tender; bowel sounds normal; no masses,  no organomegaly Extremities: extremities normal, atraumatic, no cyanosis or edema Pulses: 2+ and symmetric Skin: Skin color, texture, turgor normal. No rashes or lesions Lymph nodes: Cervical, supraclavicular, and axillary nodes normal. Neurologic: oriented x3. No cranial nerve deficits. No unequal motor strength. Basically, no focal deficits are noted.  Laboratory Data: Results for orders placed during the hospital encounter of 10/31/11 (from the past 48 hour(s))  CARBAMAZEPINE LEVEL, TOTAL     Status: Abnormal   Collection Time   11/01/11  2:41 AM      Component Value Range Comment   Carbamazepine Lvl 1.3 (*) 4.0 - 12.0 ug/mL   BASIC METABOLIC PANEL     Status: Abnormal   Collection Time   11/01/11  2:41 AM      Component Value Range Comment   Sodium 137  135 - 145 mEq/L    Potassium 3.4 (*) 3.5 - 5.1 mEq/L    Chloride 105  96 - 112 mEq/L    CO2 24  19 - 32 mEq/L    Glucose, Bld 107 (*) 70 - 99 mg/dL    BUN 11  6 - 23 mg/dL    Creatinine, Ser 1.61  0.50 - 1.10 mg/dL    Calcium 8.3 (*) 8.4 - 10.5 mg/dL    GFR calc non Af Amer >90  >90 mL/min    GFR calc Af Amer >90  >90 mL/min   CBC WITH  DIFFERENTIAL     Status: Abnormal   Collection Time   11/01/11  2:41 AM      Component Value Range Comment   WBC 5.2  4.0 - 10.5 K/uL    RBC 3.68 (*) 3.87 - 5.11 MIL/uL    Hemoglobin 11.0 (*) 12.0 - 15.0 g/dL    HCT 09.6 (*) 04.5 - 46.0 %    MCV 85.9  78.0 - 100.0 fL    MCH 29.9  26.0 - 34.0 pg    MCHC 34.8  30.0 - 36.0 g/dL    RDW 40.9  81.1 - 91.4 %    Platelets 159  150 - 400 K/uL    Neutrophils Relative 81 (*) 43 - 77 %    Neutro Abs 4.2  1.7 - 7.7 K/uL    Lymphocytes Relative 14  12 - 46 %    Lymphs Abs 0.7  0.7 - 4.0 K/uL    Monocytes Relative 4  3 - 12 %    Monocytes Absolute 0.2  0.1 - 1.0 K/uL    Eosinophils Relative 0  0 - 5 %    Eosinophils Absolute 0.0  0.0 - 0.7 K/uL    Basophils Relative 0  0 - 1 %    Basophils Absolute 0.0  0.0 - 0.1 K/uL   URINALYSIS, ROUTINE W REFLEX MICROSCOPIC     Status: Abnormal   Collection Time   11/01/11  2:41 AM      Component Value Range Comment   Color, Urine YELLOW  YELLOW    APPearance CLOUDY (*) CLEAR    Specific Gravity, Urine 1.025  1.005 - 1.030    pH 6.0  5.0 - 8.0    Glucose, UA NEGATIVE  NEGATIVE mg/dL    Hgb urine dipstick SMALL (*) NEGATIVE    Bilirubin Urine NEGATIVE  NEGATIVE    Ketones, ur NEGATIVE  NEGATIVE mg/dL    Protein, ur NEGATIVE  NEGATIVE mg/dL    Urobilinogen, UA 0.2  0.0 - 1.0 mg/dL    Nitrite NEGATIVE  NEGATIVE    Leukocytes, UA TRACE (*) NEGATIVE   URINE MICROSCOPIC-ADD ON     Status: Abnormal   Collection Time   11/01/11  2:41 AM      Component Value Range Comment   Squamous Epithelial / LPF FEW (*) RARE    WBC, UA 3-6  <3 WBC/hpf    RBC / HPF 0-2  <3 RBC/hpf    Bacteria, UA FEW (*) RARE    Urine-Other MUCOUS PRESENT     POCT PREGNANCY, URINE     Status: Normal   Collection Time   11/01/11  4:19 AM      Component Value Range Comment   Preg Test, Ur NEGATIVE  NEGATIVE     Radiology Reports: Mr Brain Wo Contrast  11/01/2011  *RADIOLOGY REPORT*  Clinical Data: Ataxia.  History of epilepsy.  Headache.   Nausea and vomiting.  Ringing in the left ear.  MRI HEAD WITHOUT CONTRAST  Technique:  Multiplanar, multiecho pulse sequences of the brain and surrounding structures were obtained according to standard protocol without intravenous contrast.  Comparison: CT head without contrast 12/02/2010.  Findings: No acute intracranial abnormalities are present. Specifically, there is no evidence for acute infarct, hemorrhage, mass, hydrocephalus, or significant extra-axial fluid collection. Flow is present in the major intracranial arteries.  The globes and orbits are intact.  The paranasal sinuses and mastoid air cells are clear.  IMPRESSION: Negative MRI of the brain.   Original Report Authenticated By: Jamesetta Orleans. MATTERN, M.D.      Assessment/Plan  Active Problems:  Vertigo  Labyrinthitis  Seizure disorder  Asthma   #1 vertigo: It appears, that she may have labyrinthitis. This could explain her symptoms. However, because of her history of seizure, neurologist, was involved. MRI of the brain was done, which was negative. Neurology is recommending MRA of the head and neck as well. We will treat her with meclizine and Valium and Phenergan as needed. PT/OT will be asked to see the patient for vestibular exercises. She'll be given clear liquids for now.  #2 abdominal pain: She did have some right upper quadrant tenderness. We will check LFTs and lipase level. However, it appears, that majority of her symptoms are secondary to her vertigo. Urine pregnancy test was negative.  #3 history of seizure disorder: Continue with the Trileptal. Seizure precautions will be utilized.  #4 history of asthma: Continue with albuterol as needed. Continue  with Singulair.  #5 abnormal, UA: We will check a urine culture. She's afebrile. No treatment for now.  She is a full code.  DVT, prophylaxis will utilize.  Further management decisions will depend on results of further testing and patient's response to  treatment.  Labs will be repeated this morning. Potassium level was low last night. However, we will repeat her labs today and decide how to treat.  [facility]  [facility] Pager [phone]  11/01/2011, 12:36 PM

## 2011-11-02 DIAGNOSIS — R279 Unspecified lack of coordination: Secondary | ICD-10-CM

## 2011-11-02 DIAGNOSIS — R112 Nausea with vomiting, unspecified: Secondary | ICD-10-CM | POA: Diagnosis present

## 2011-11-02 LAB — COMPREHENSIVE METABOLIC PANEL
AST: 14 U/L (ref 0–37)
Albumin: 3.4 g/dL — ABNORMAL LOW (ref 3.5–5.2)
BUN: 6 mg/dL (ref 6–23)
Chloride: 107 mEq/L (ref 96–112)
Creatinine, Ser: 0.85 mg/dL (ref 0.50–1.10)
Potassium: 4.1 mEq/L (ref 3.5–5.1)
Total Bilirubin: 0.3 mg/dL (ref 0.3–1.2)
Total Protein: 5.9 g/dL — ABNORMAL LOW (ref 6.0–8.3)

## 2011-11-02 LAB — CBC
HCT: 32 % — ABNORMAL LOW (ref 36.0–46.0)
MCH: 29.1 pg (ref 26.0–34.0)
MCV: 86.3 fL (ref 78.0–100.0)
RBC: 3.71 MIL/uL — ABNORMAL LOW (ref 3.87–5.11)
WBC: 2.5 10*3/uL — ABNORMAL LOW (ref 4.0–10.5)

## 2011-11-02 MED ORDER — PANTOPRAZOLE SODIUM 40 MG IV SOLR
40.0000 mg | Freq: Every day | INTRAVENOUS | Status: DC
  Administered 2011-11-02: 40 mg via INTRAVENOUS
  Filled 2011-11-02 (×2): qty 40

## 2011-11-02 MED ORDER — DIAZEPAM 5 MG PO TABS: ORAL_TABLET | ORAL | Status: DC

## 2011-11-02 MED ORDER — UNABLE TO FIND: Status: DC

## 2011-11-02 MED ORDER — MECLIZINE HCL 25 MG PO TABS: ORAL_TABLET | ORAL | Status: DC

## 2011-11-02 MED ORDER — PROMETHAZINE HCL 12.5 MG PO TABS: 12.5000 mg | ORAL_TABLET | Freq: Four times a day (QID) | ORAL | Status: DC | PRN

## 2011-11-02 NOTE — Progress Notes (Signed)
PCP: MCNEILL,WENDY, MD  Brief HPI: This is a 21 year old, Caucasian female, with a past medical history of seizure disorder, which was diagnosed one year ago. She also has a history of asthma. She presented to the hospital with complaints of abdominal pain, nausea, vomiting, and dizziness, which started on Wednesday. She mentioned that she initially started having nausea and vomiting and then on Thursday morning she started having dizziness, which she describes as if the room was spinning around her. She says she's passed out a few times because of this. She has had some chills but denied any fever. She's never had similar symptoms in the past. Denied any recent medication changes. She's been having some pain in the left ear for the last few days. Also admits to a ringing sensation in the ear. Denied any ear discharge. The dizziness is worse with movement. And that exacerbates her nausea and vomiting. She just finished her menstrual period. She denied being on oral contraceptives. She's not any in any sexual relationship at this time. After receiving multiple medications in the ED, patient felt better.   Past medical history:  Past Medical History  Diagnosis Date  . Asthma   . Seizures     Consultants: Neurology  Procedures: None  Subjective: Patient feels better compared to yesterday. Still with occasional dizzy spells and nausea. Tolerated breakfast this morning. Got dizzy and nauseas with therapist.  Objective: Vital signs in last 24 hours: Temp:  [98.1 F (36.7 C)-98.6 F (37 C)] 98.4 F (36.9 C) (09/02 1343) Pulse Rate:  [61-93] 93  (09/02 1343) Resp:  [18] 18  (09/02 1343) BP: (111-127)/(69-83) 127/83 mmHg (09/02 1343) SpO2:  [100 %] 100 % (09/02 1343) Weight:  [49.8 kg (109 lb 12.6 oz)-50.395 kg (111 lb 1.6 oz)] 49.8 kg (109 lb 12.6 oz) (09/02 0509) Weight change:  Last BM Date: 11/02/11  Intake/Output from previous day:   Intake/Output this shift: Total I/O In: 480  [P.O.:480] Out: -   General appearance: alert, cooperative, appears stated age and no distress Head: Normocephalic, without obvious abnormality, atraumatic Eyes: conjunctivae/corneas clear. PERRL, EOM's intact. Nystagmus is present Resp: clear to auscultation bilaterally Cardio: regular rate and rhythm, S1, S2 normal, no murmur, click, rub or gallop GI: soft, mildly tender in upper abdomen. bowel sounds normal; no masses,  no organomegaly Extremities: extremities normal, atraumatic, no cyanosis or edema Pulses: 2+ and symmetric Skin: Skin color, texture, turgor normal. No rashes or lesions Lymph nodes: Cervical, supraclavicular, and axillary nodes normal. Neurologic: No focal deficits. Alert and oriented x 3.  Lab Results:  Basename 11/02/11 0535 11/01/11 0241  WBC 2.5* 5.2  HGB 10.8* 11.0*  HCT 32.0* 31.6*  PLT 143* 159   BMET  Basename 11/02/11 0535 11/01/11 1229  NA 142 140  K 4.1 3.4*  CL 107 107  CO2 30 28  GLUCOSE 89 86  BUN 6 8  CREATININE 0.85 0.88  CALCIUM 8.9 9.0  ALT 10 10    Studies/Results: Mr Shirlee Latch Wo Contrast  11/01/2011  *RADIOLOGY REPORT*  Clinical Data:  Vertigo.  Seizures.  MRA HEAD WITHOUT CONTRAST  Technique:  Angiographic images of the Circle of Willis were obtained using MRA technique without intravenous contrast.  Comparison:  MRI brain 11/01/2011.  Findings:  The internal carotid arteries are within normal limits from high cervical segments through the ICA termini.  The A1 and M1 segments are normal.  The anterior communicating artery is patent. The ACA and MCA branch vessels are within  normal limits bilaterally.  The left vertebral artery is the dominant vessel.  The PICA origins are visualized and within normal limits bilaterally.  The basilar artery is normal.  Both posterior cerebral arteries originate from basilar tip.  The PCA branch vessels are unremarkable.  IMPRESSION: Normal MRA circle of Willis.  No significant proximal stenosis, aneurysm,  or branch vessel occlusion.  *RADIOLOGY REPORT*  MRA NECK WITHOUT AND WITH CONTRAST  Technique:  Angiographic images of the neck were obtained using MRA technique without and with intravenous contrast.  Carotid stenosis measurements (when applicable) are obtained utilizing NASCET criteria, using the distal internal carotid diameter as the denominator.  Contrast: 10mL MULTIHANCE GADOBENATE DIMEGLUMINE 529 MG/ML IV SOLN  Findings:  The time-of-flight images demonstrate no significant flow disturbance at either carotid bifurcation.  Flow is antegrade in the vertebral arteries bilaterally.  A standard three-vessel arch configuration is present.  The vertebral arteries originate from the subclavian arteries bilaterally.  The left vertebral artery is dominant.  There is no significant stenosis.  The common carotid arteries and carotid artery bifurcations are within normal limits bilaterally.  The internal carotid arteries are straight and normal bilaterally.  IMPRESSION: Negative MRA of the neck.   Original Report Authenticated By: Jamesetta Orleans. MATTERN, M.D.    Mr Angiogram Neck W Wo Contrast  11/01/2011  *RADIOLOGY REPORT*  Clinical Data:  Vertigo.  Seizures.  MRA HEAD WITHOUT CONTRAST  Technique:  Angiographic images of the Circle of Willis were obtained using MRA technique without intravenous contrast.  Comparison:  MRI brain 11/01/2011.  Findings:  The internal carotid arteries are within normal limits from high cervical segments through the ICA termini.  The A1 and M1 segments are normal.  The anterior communicating artery is patent. The ACA and MCA branch vessels are within normal limits bilaterally.  The left vertebral artery is the dominant vessel.  The PICA origins are visualized and within normal limits bilaterally.  The basilar artery is normal.  Both posterior cerebral arteries originate from basilar tip.  The PCA branch vessels are unremarkable.  IMPRESSION: Normal MRA circle of Willis.  No significant  proximal stenosis, aneurysm, or branch vessel occlusion.  *RADIOLOGY REPORT*  MRA NECK WITHOUT AND WITH CONTRAST  Technique:  Angiographic images of the neck were obtained using MRA technique without and with intravenous contrast.  Carotid stenosis measurements (when applicable) are obtained utilizing NASCET criteria, using the distal internal carotid diameter as the denominator.  Contrast: 10mL MULTIHANCE GADOBENATE DIMEGLUMINE 529 MG/ML IV SOLN  Findings:  The time-of-flight images demonstrate no significant flow disturbance at either carotid bifurcation.  Flow is antegrade in the vertebral arteries bilaterally.  A standard three-vessel arch configuration is present.  The vertebral arteries originate from the subclavian arteries bilaterally.  The left vertebral artery is dominant.  There is no significant stenosis.  The common carotid arteries and carotid artery bifurcations are within normal limits bilaterally.  The internal carotid arteries are straight and normal bilaterally.  IMPRESSION: Negative MRA of the neck.   Original Report Authenticated By: Jamesetta Orleans. MATTERN, M.D.    Mr Brain Wo Contrast  11/01/2011  *RADIOLOGY REPORT*  Clinical Data: Ataxia.  History of epilepsy.  Headache.  Nausea and vomiting.  Ringing in the left ear.  MRI HEAD WITHOUT CONTRAST  Technique:  Multiplanar, multiecho pulse sequences of the brain and surrounding structures were obtained according to standard protocol without intravenous contrast.  Comparison: CT head without contrast 12/02/2010.  Findings: No acute intracranial abnormalities are present.  Specifically, there is no evidence for acute infarct, hemorrhage, mass, hydrocephalus, or significant extra-axial fluid collection. Flow is present in the major intracranial arteries.  The globes and orbits are intact.  The paranasal sinuses and mastoid air cells are clear.  IMPRESSION: Negative MRI of the brain.   Original Report Authenticated By: Jamesetta Orleans. MATTERN, M.D.       Medications:  Scheduled:   . diazepam  5 mg Oral Q8H  . enoxaparin (LOVENOX) injection  40 mg Subcutaneous Q24H  . meclizine  25 mg Oral TID  . montelukast  10 mg Oral QHS  . OXcarbazepine  600 mg Oral q morning - 10a  . OXcarbazepine  750 mg Oral QHS  . pantoprazole (PROTONIX) IV  40 mg Intravenous QHS  . potassium chloride  40 mEq Oral Once   Continuous:   . sodium chloride 75 mL (11/01/11 1438)  . DISCONTD: sodium chloride 1,000 mL (11/01/11 0626)   XBM:[id number], acetaminophen, albuterol, albuterol, ondansetron (ZOFRAN) IV, ondansetron, promethazine  Assessment/Plan:  Active Problems:  Vertigo  Labyrinthitis  Seizure disorder  Asthma    Vertigo Most likely secondary to labyrinthitis. She is feeling better. Continue current treatment for now. Initial plan was to discharge her but she got very symptomatic with OT. We will keep her here for 1 more day. Appreciate neurology input. PT/OT to continue.    Abdominal pain/N/V Likely from vertigo. The pain is probably from multiple episodes of vomiting. Will initiate PPI. LFT and Lipase was normal. Urine pregnancy test was negative.   History of seizure disorder Continue with the Trileptal. Seizure precautions will be utilized.   History of Asthma Continue with albuterol as needed. Continue with Singulair.   Abnormal UA without symptoms Afebrile. We will check a urine culture. No treatment for now.   Code Status She is a full code.   DVT, prophylaxis Enoxaparin  Disposition Will go home on discharge. Possibly 9/3.   LOS: 2 days   [facility]  [facility] Pager [phone] 11/02/2011, 2:51 PM

## 2011-11-02 NOTE — Progress Notes (Addendum)
[facility] PROGRESS NOTE    SUBJECTIVE   States he vertigo has significantly improved.  She feels she may be drifting slightly to the left side when getting up.  She has been seen by Vestibular rehab.  OBJECTIVE   Vital signs in last 24 hours: Temp:  [98.1 F (36.7 C)-98.9 F (37.2 C)] 98.1 F (36.7 C) (09/02 0509) Pulse Rate:  [61-77] 61  (09/02 0509) Resp:  [18-20] 18  (09/02 0509) BP: (103-129)/(60-80) 117/77 mmHg (09/02 0509) SpO2:  [98 %-100 %] 100 % (09/02 0509) Weight:  [49.8 kg (109 lb 12.6 oz)-50.395 kg (111 lb 1.6 oz)] 49.8 kg (109 lb 12.6 oz) (09/02 0509)  Intake/Output from previous day:   Intake/Output this shift:   Nutritional status: Clear Liquid  Past Medical History  Diagnosis Date  . Asthma   . Seizures     Neurologic ROS negative with exception of above   Neurologic Exam:  Mental Status: Alert, oriented X 3.  Speech fluent without evidence of aphasia. Able to follow 3 step commands without difficulty. Mood: upbeat Memory: intact Thought content appropriate Cranial Nerves: II-Visual fields grossly intact. (she showed inconsistent exam) III/IV/VI-Extraocular movements intact.  Pupils reactive bilaterally. Ptosis not present. No nystagmus present.  When asked to look at my finger and then my face her eye movements were slow and hesitant but when someone walked into the room she quickly looked over at them without problems.  V/VII-Smile symmetric VIII-grossly intact IX/X-normal gag XI-bilateral shoulder shrug XII-midline tongue extension Motor: 5/5 bilaterally with normal tone and bulk Sensory: Pinprick and light touch intact throughout, bilaterally Deep Tendon Reflexes:  Right: Upper Extremity   Left: Upper extremity   biceps (C-5 to C-6) 2/4   biceps (C-5 to C-6) 2/4 tricep (C7) 2/4    triceps (C7) 2/4 Brachioradialis (C6) 2/4  Brachioradialis (C6) 2/4  Lower Extremity Lower Extremity    quadriceps (L-2 to L-4) 2/4   quadriceps (L-2 to L-4) 2/4 Achilles (S1) 2/4   Achilles (S1) 2/4      Plantars:      Right:  downgoing     Left:  Downgoing Cerebellar: Normal finger-to-nose, normal rapid alternating movements and normal heel-to-shin test.   Gait: Normal gait and station.   Lab Results: Results for orders placed during the hospital encounter of 10/31/11 (from the past 24 hour(s))  COMPREHENSIVE METABOLIC PANEL     Status: Abnormal   Collection Time   11/01/11 12:29 PM      Component Value Range   Sodium 140  135 - 145 mEq/L   Potassium 3.4 (*) 3.5 - 5.1 mEq/L   Chloride 107  96 - 112 mEq/L   CO2 28  19 - 32 mEq/L   Glucose, Bld 86  70 - 99 mg/dL   BUN 8  6 - 23 mg/dL   Creatinine, Ser 1.61  0.50 - 1.10 mg/dL   Calcium 9.0  8.4 - 09.6 mg/dL   Total Protein 6.2  6.0 - 8.3 g/dL   Albumin 3.6  3.5 - 5.2 g/dL   AST 14  0 - 37 U/L   ALT 10  0 - 35 U/L   Alkaline Phosphatase 35 (*) 39 - 117 U/L   Total Bilirubin 0.3  0.3 - 1.2 mg/dL  GFR calc non Af Amer >90  >90 mL/min   GFR calc Af Amer >90  >90 mL/min  LIPASE, BLOOD     Status: Normal   Collection Time   11/01/11 12:29 PM      Component Value Range   Lipase 22  11 - 59 U/L  SALICYLATE LEVEL     Status: Normal   Collection Time   11/01/11 12:29 PM      Component Value Range   Salicylate Lvl 6.2  2.8 - 20.0 mg/dL  COMPREHENSIVE METABOLIC PANEL     Status: Abnormal   Collection Time   11/02/11  5:35 AM      Component Value Range   Sodium 142  135 - 145 mEq/L   Potassium 4.1  3.5 - 5.1 mEq/L   Chloride 107  96 - 112 mEq/L   CO2 30  19 - 32 mEq/L   Glucose, Bld 89  70 - 99 mg/dL   BUN 6  6 - 23 mg/dL   Creatinine, Ser 4.09  0.50 - 1.10 mg/dL   Calcium 8.9  8.4 - 81.1 mg/dL   Total Protein 5.9 (*) 6.0 - 8.3 g/dL   Albumin 3.4 (*) 3.5 - 5.2 g/dL   AST 14  0 - 37 U/L   ALT 10  0 - 35 U/L   Alkaline Phosphatase 35 (*) 39 - 117 U/L   Total Bilirubin 0.3  0.3 - 1.2 mg/dL   GFR calc non Af Amer >90  >90 mL/min    GFR calc Af Amer >90  >90 mL/min  CBC     Status: Abnormal   Collection Time   11/02/11  5:35 AM      Component Value Range   WBC 2.5 (*) 4.0 - 10.5 K/uL   RBC 3.71 (*) 3.87 - 5.11 MIL/uL   Hemoglobin 10.8 (*) 12.0 - 15.0 g/dL   HCT 91.4 (*) 78.2 - 95.6 %   MCV 86.3  78.0 - 100.0 fL   MCH 29.1  26.0 - 34.0 pg   MCHC 33.8  30.0 - 36.0 g/dL   RDW 21.3  08.6 - 57.8 %   Platelets 143 (*) 150 - 400 K/uL   Lipid Panel No results found for this basename: CHOL,TRIG,HDL,CHOLHDL,VLDL,LDLCALC in the last 72 hours  Studies/Results: Mr Shirlee Latch Wo Contrast  11/01/2011  *RADIOLOGY REPORT*  Clinical Data:  Vertigo.  Seizures.  MRA HEAD WITHOUT CONTRAST  Technique:  Angiographic images of the Circle of Willis were obtained using MRA technique without intravenous contrast.  Comparison:  MRI brain 11/01/2011.  Findings:  The internal carotid arteries are within normal limits from high cervical segments through the ICA termini.  The A1 and M1 segments are normal.  The anterior communicating artery is patent. The ACA and MCA branch vessels are within normal limits bilaterally.  The left vertebral artery is the dominant vessel.  The PICA origins are visualized and within normal limits bilaterally.  The basilar artery is normal.  Both posterior cerebral arteries originate from basilar tip.  The PCA branch vessels are unremarkable.  IMPRESSION: Normal MRA circle of Willis.  No significant proximal stenosis, aneurysm, or branch vessel occlusion.  *RADIOLOGY REPORT*  MRA NECK WITHOUT AND WITH CONTRAST  Technique:  Angiographic images of the neck were obtained using MRA technique without and with intravenous contrast.  Carotid stenosis measurements (when applicable) are obtained utilizing NASCET criteria, using the distal internal carotid diameter as the denominator.  Contrast: 10mL MULTIHANCE GADOBENATE DIMEGLUMINE  529 MG/ML IV SOLN  Findings:  The time-of-flight images demonstrate no significant flow disturbance at either  carotid bifurcation.  Flow is antegrade in the vertebral arteries bilaterally.  A standard three-vessel arch configuration is present.  The vertebral arteries originate from the subclavian arteries bilaterally.  The left vertebral artery is dominant.  There is no significant stenosis.  The common carotid arteries and carotid artery bifurcations are within normal limits bilaterally.  The internal carotid arteries are straight and normal bilaterally.  IMPRESSION: Negative MRA of the neck.   Original Report Authenticated By: Jamesetta Orleans. MATTERN, M.D.    Mr Angiogram Neck W Wo Contrast  11/01/2011  *RADIOLOGY REPORT*  Clinical Data:  Vertigo.  Seizures.  MRA HEAD WITHOUT CONTRAST  Technique:  Angiographic images of the Circle of Willis were obtained using MRA technique without intravenous contrast.  Comparison:  MRI brain 11/01/2011.  Findings:  The internal carotid arteries are within normal limits from high cervical segments through the ICA termini.  The A1 and M1 segments are normal.  The anterior communicating artery is patent. The ACA and MCA branch vessels are within normal limits bilaterally.  The left vertebral artery is the dominant vessel.  The PICA origins are visualized and within normal limits bilaterally.  The basilar artery is normal.  Both posterior cerebral arteries originate from basilar tip.  The PCA branch vessels are unremarkable.  IMPRESSION: Normal MRA circle of Willis.  No significant proximal stenosis, aneurysm, or branch vessel occlusion.  *RADIOLOGY REPORT*  MRA NECK WITHOUT AND WITH CONTRAST  Technique:  Angiographic images of the neck were obtained using MRA technique without and with intravenous contrast.  Carotid stenosis measurements (when applicable) are obtained utilizing NASCET criteria, using the distal internal carotid diameter as the denominator.  Contrast: 10mL MULTIHANCE GADOBENATE DIMEGLUMINE 529 MG/ML IV SOLN  Findings:  The time-of-flight images demonstrate no significant  flow disturbance at either carotid bifurcation.  Flow is antegrade in the vertebral arteries bilaterally.  A standard three-vessel arch configuration is present.  The vertebral arteries originate from the subclavian arteries bilaterally.  The left vertebral artery is dominant.  There is no significant stenosis.  The common carotid arteries and carotid artery bifurcations are within normal limits bilaterally.  The internal carotid arteries are straight and normal bilaterally.  IMPRESSION: Negative MRA of the neck.   Original Report Authenticated By: Jamesetta Orleans. MATTERN, M.D.    Mr Brain Wo Contrast  11/01/2011  *RADIOLOGY REPORT*  Clinical Data: Ataxia.  History of epilepsy.  Headache.  Nausea and vomiting.  Ringing in the left ear.  MRI HEAD WITHOUT CONTRAST  Technique:  Multiplanar, multiecho pulse sequences of the brain and surrounding structures were obtained according to standard protocol without intravenous contrast.  Comparison: CT head without contrast 12/02/2010.  Findings: No acute intracranial abnormalities are present. Specifically, there is no evidence for acute infarct, hemorrhage, mass, hydrocephalus, or significant extra-axial fluid collection. Flow is present in the major intracranial arteries.  The globes and orbits are intact.  The paranasal sinuses and mastoid air cells are clear.  IMPRESSION: Negative MRI of the brain.   Original Report Authenticated By: Jamesetta Orleans. MATTERN, M.D.     Medications:     Prior to Admission:  Prescriptions prior to admission  Medication Sig Dispense Refill  . albuterol (PROVENTIL) (2.5 MG/3ML) 0.083% nebulizer solution Take 2.5 mg by nebulization every 6 (six) hours as needed. For shortness of breath      . aspirin EC 325 MG tablet Take  325 mg by mouth every 6 (six) hours as needed. For headache      . Cetirizine HCl (KLS ALLER-TEC PO) Take 1 tablet by mouth daily.      . Oxcarbazepine (TRILEPTAL) 300 MG tablet Take 600-750 mg by mouth See admin  instructions. Takes 2 tablets in the morning, and 2.5 tablets at night.      Marland Kitchen albuterol (PROVENTIL HFA;VENTOLIN HFA) 108 (90 BASE) MCG/ACT inhaler Inhale 2 puffs into the lungs every 6 (six) hours as needed. For asthma symptoms      . montelukast (SINGULAIR) 10 MG tablet Take 10 mg by mouth at bedtime.       Scheduled:   . diazepam  5 mg Oral Q8H  . diphenhydrAMINE  25 mg Intravenous Once  . enoxaparin (LOVENOX) injection  40 mg Subcutaneous Q24H  . meclizine  25 mg Oral TID  . montelukast  10 mg Oral QHS  . OXcarbazepine  600 mg Oral To ER  . OXcarbazepine  600 mg Oral q morning - 10a  . OXcarbazepine  750 mg Oral QHS  . potassium chloride  40 mEq Oral Once    Assessment/Plan:  21 y/o WF with history of partial seizures, depression and asthma comes with N/V nystagmus, tinnitus, fullness in the ears, diplopia. MRI of the brain is negative for any etiology, MRA of the head and neck are negative for vascular disease Exam shows mild inconsistency with eye movements and visual fields.   Patient feels significantly improved on medications.   Patient Active Hospital Problem List: Vertigo (11/01/2011)/ Labyrinthitis (11/01/2011)   Assessment: symptoms have improved with valium and meclizine.    Plan: Vestibular rehab. continue symptomatic treatment    No further treatment recommendations.   Neurology will sign off.    Discussed with Dr. Lethea Killings PA-C [facility] [phone]  11/02/2011, 10:00 AM      Reviewed MRI and MRA of the head and neck. Reviewed labs and images. Please d/C Valium after 2 days.  It has high risk of addiction Will sign off. If needed please do not hesitate to call Thank you  Amayia Ciano V-P Eilleen Kempf., MD., Ph.D.,MS 11/02/2011 10:46 AM

## 2011-11-02 NOTE — Progress Notes (Signed)
   CARE MANAGEMENT NOTE 11/02/2011  Patient:  [name],[name]   Account Number:  [id number]  Date Initiated:  11/02/2011  Documentation initiated by:  [name] [name]  Subjective/Objective Assessment:   Patient admitted with vertigo     Action/Plan:   Progression of care and discharge planning   Anticipated DC Date:     Anticipated DC Plan:        DC Planning Services  CM consult      Choice offered to / List presented to:     DME arranged  [name] [name]      DME agency  [facility]        Status of service:  In process, will continue to follow Medicare Important Message given?   (If response is "NO", the following Medicare IM given date fields will be blank) Date Medicare IM given:   Date Additional Medicare IM given:    Discharge Disposition:    Per UR Regulation:    If discussed at Long Length of Stay Meetings, dates discussed:    Comments:  11/02/2011  [address] RN, [place]  [phone] [facility]/[name] [name] with referral for RW.

## 2011-11-02 NOTE — Evaluation (Addendum)
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  Initiated x1 exercises in which patient performed well overall.  Patient will neeed to use a RW initially and have Outpt. PT f/u for vestibular rehab.  MD: Please give patient a prescription for the Outpt PT for vestibular Rehab.  Thanks.  Patient reports that she wants to go home with roommate.  Will have intermittent supervision.  Would like to have one more treatment with her in the am if possible prior to d/c home unless patient gets more comfortable walking with RW with nursing today.      PT Assessment  Patient needs continued PT services    Follow Up Recommendations  Outpatient PT;Supervision - Intermittent    Barriers to Discharge        Equipment Recommendations  Rolling walker with 5" wheels    Recommendations for Other Services     Frequency Min 3X/week    Precautions / Restrictions Precautions Precautions: Fall Restrictions Weight Bearing Restrictions: No   Pertinent Vitals/Pain VSS, No pain      Mobility  Bed Mobility Bed Mobility: Supine to Sit Supine to Sit: 7: Independent Details for Bed Mobility Assistance: Performed vestibular testing while patient sitting on EOB.  - Hallpike and supine head roll test.  + head thrust to left, suggesting left vestibular hypofunction.  Initiated x1 gaze stabillity exercises.  Patient able to perform exercise side to side 20 seconds with 6/10 dizziness, 57 seconds with 7/10 dizziness.  Encouraged to perform 3 reps side to side and 1 rep up and down 5 x daily in sitting.  Then PT will progress to standing when patient is ready.   Transfers Transfers: Sit to Stand;Stand to Sit Sit to Stand: 4: Min guard;With upper extremity  assist;From bed Stand to Sit: 4: Min guard;With upper extremity assist;To bed Ambulation/Gait Ambulation/Gait Assistance: 4: Min assist Ambulation Distance (Feet): 350 Feet Assistive device: Rolling walker Ambulation/Gait Assistance Details: Placed target "A" on IV pole.  Patient ambulated using target "A" on IV pole with better gait than without target.  Without target, patient loses balance to left.  Needs to use RW with target "A" for home.  Pt. agrees.  Also suggested other compensatory strategies for patient to turn eyes, then head then body.  Pt. can demonstrate strategies.   Gait Pattern: Step-through pattern;Decreased stride length;Ataxic Gait velocity: decreased General Gait Details: PAtient had somewhat ataxic gait.  Needed steadying assist throughout ambulation.  Patient reports that targets make a difference in her ability to ambulate more safely and that prior to using targets she would "lean to left greatly".   Stairs: No Wheelchair Mobility Wheelchair Mobility: No         PT Diagnosis: Generalized weakness (dizziness)  PT Problem List: Decreased activity tolerance;Decreased balance;Decreased mobility;Decreased safety awareness;Decreased knowledge of use of DME PT Treatment Interventions: Gait training;DME instruction;Functional mobility training;Therapeutic activities;Therapeutic exercise;Balance training;Patient/family education   PT Goals Acute Rehab PT Goals PT Goal Formulation: With patient Time For Goal Achievement: 11/09/11 Potential to Achieve Goals: Good Pt will go Supine/Side to Sit: Independently PT Goal: Supine/Side to Sit - Progress: Goal set today Pt will go Sit to Stand: Independently PT Goal: Sit to Stand - Progress: Goal set today Pt will Stand: Independently;3 - 5 min;with no upper extremity support PT Goal:  Stand - Progress: Goal set today Pt will Ambulate: 51 - 150 feet;with modified independence;with least restrictive assistive device PT Goal:  Ambulate - Progress: Goal set today Pt will Perform Home Exercise Program: Independently PT Goal: Perform Home Exercise Program - Progress: Goal set today Additional Goals Additional Goal #1: PAtient to utilize compensatory strategies to assist with decreasing dizziness with movements to <3/10.   PT Goal: Additional Goal #1 - Progress: Goal set today  Visit Information  Last PT Received On: 11/02/11 Assistance Needed: +1    Subjective Data  Subjective: "I am feeling better everyday." Patient Stated Goal: To go home   Prior Functioning  Home Living Lives With: Friend(s) Available Help at Discharge: Friend(s);Available PRN/intermittently Type of Home: House Home Access: Stairs to enter [employer] of Steps: few Home Layout: One level Firefighter: Standard Home Adaptive Equipment: None Prior Function Level of Independence: Independent Able to Take Stairs?: Yes Driving: Yes Communication Communication: No difficulties    Cognition  Overall Cognitive Status: Appears within functional limits for tasks assessed/performed Arousal/Alertness: Awake/alert Orientation Level: Appears intact for tasks assessed Behavior During Session: [facility] for tasks performed    Extremity/Trunk Assessment Right Upper Extremity Assessment RUE ROM/Strength/Tone: [facility] for tasks assessed Left Upper Extremity Assessment LUE ROM/Strength/Tone: WFL for tasks assessed Right Lower Extremity Assessment RLE ROM/Strength/Tone: [facility] for tasks assessed Left Lower Extremity Assessment LLE ROM/Strength/Tone: [facility] for tasks assessed Trunk Assessment Trunk Assessment: Normal   Balance Static Standing Balance Static Standing - Balance Support: Bilateral upper extremity supported;During functional activity Static Standing - Level of Assistance: 4: Min assist Static Standing - Comment/# of Minutes: 2 minutes with pt unsteady unless holding a device.    End of Session PT - End of Session Equipment  Utilized During Treatment: Gait belt Activity Tolerance: Patient tolerated treatment well Patient left: in bed;with call bell/phone within reach;with family/visitor present Nurse Communication: Mobility status       INGOLD,Loras Grieshop 11/02/2011, 10:40 AM  [facility] [phone] (pager)

## 2011-11-02 NOTE — Evaluation (Signed)
Occupational Therapy Evaluation Patient Details Name: Amoreena Neubert MRN: [id number] DOB: June 14, 1990 Today's Date: 11/02/2011 Time: 4098-1191 OT Time Calculation (min): 30 min  OT Assessment / Plan / Recommendation Clinical Impression  Pt presents to OT with decreased I with ADL activity due to dizziness.  Pt will benefit from OP vestibular rehab to increase I with ADL activity and decrease burden on family    OT Assessment  Patient needs continued OT Services    Follow Up Recommendations  Other (comment) (OP vestibularrehab)       Equipment Recommendations  Rolling walker with 5" wheels       Frequency  Min 2X/week    Precautions / Restrictions Precautions Precautions: Fall Restrictions Weight Bearing Restrictions: No       ADL  Eating/Feeding: Performed;Set up Where Assessed - Eating/Feeding: Edge of bed Grooming: Simulated Where Assessed - Grooming: Unsupported sitting Upper Body Bathing: Min guard;Simulated Where Assessed - Upper Body Bathing: Unsupported sitting Lower Body Bathing: Simulated;Moderate assistance Where Assessed - Lower Body Bathing: Supported sit to stand Upper Body Dressing: Simulated;Minimal assistance Where Assessed - Upper Body Dressing: Unsupported sitting Lower Body Dressing: Simulated Where Assessed - Lower Body Dressing: Supported sit to stand Toilet Transfer: Simulated;Moderate assistance;Other (comment) (lost balance L and posterior) Toilet Transfer Method: Sit to stand Transfers/Ambulation Related to ADLs: Pt felt as if she was going to vomit during OT session. OT encouraged focusing on A. No nystagmus noted or double vision reported. Educated pt she needed help each time she went to bathroom due to risk of falling. Pt lost balance  several times with OT. ADL Comments: Pt also educated to use shower seat in shower to decrease risk of falling.  Pt reported sisters husband had a seat she could use.      OT Diagnosis: Generalized weakness  OT  Problem List: Decreased strength;Decreased activity tolerance OT Treatment Interventions: Self-care/ADL training;Patient/family education;DME and/or AE instruction;Other (comment) (vestibular rehab)   OT Goals Acute Rehab OT Goals OT Goal Formulation: With patient ADL Goals Pt Will Perform Lower Body Dressing: with supervision;Sit to stand from chair ADL Goal: Lower Body Dressing - Progress: Goal set today Pt Will Transfer to Toilet: with modified independence;Comfort height toilet ADL Goal: Toilet Transfer - Progress: Goal set today Pt Will Perform Tub/Shower Transfer: Tub transfer;Shower seat with back;with supervision ADL Goal: Web designer - Progress: Goal set today Miscellaneous OT Goals Miscellaneous OT Goal #1: Pt will use compensation strategies for dizziness to increase I and safety with ADL activity with S,  Visit Information  Last OT Received On: 11/02/11 Assistance Needed: +1    Subjective Data  Subjective: I feel like i might throw up   Prior Functioning  Vision/Perception  Home Living Lives With: Friend(s) Available Help at Discharge: Friend(s);Available PRN/intermittently Type of Home: House Home Access: Stairs to enter [employer] of Steps: few Home Layout: One level Bathroom Shower/Tub: Engineer, manufacturing systems: Standard Home Adaptive Equipment: None Prior Function Level of Independence: Independent Able to Take Stairs?: Yes Driving: Yes Communication Communication: No difficulties Dominant Hand: Right   Vision - Assessment Additional Comments: Pt did not report any double vision.  OT did not note any nystagmus during OT eval. Visual transitions seemed to take increased time.    Cognition  Overall Cognitive Status: Appears within functional limits for tasks assessed/performed Arousal/Alertness: Awake/alert Orientation Level: Appears intact for tasks assessed Behavior During Session: [facility] for tasks performed      Extremity/Trunk Assessment Right Upper Extremity Assessment RUE ROM/Strength/Tone: [facility]  for tasks assessed Left Upper Extremity Assessment LUE ROM/Strength/Tone: [facility] for tasks assessed Right Lower Extremity Assessment RLE ROM/Strength/Tone: [facility] for tasks assessed Left Lower Extremity Assessment LLE ROM/Strength/Tone: [facility] for tasks assessed Trunk Assessment Trunk Assessment: Normal   Mobility  Shoulder Instructions  Bed Mobility Bed Mobility: Supine to Sit Supine to Sit: 7: Independent Details for Bed Mobility Assistance: Performed vestibular testing while patient sitting on EOB.  - Hallpike and supine head roll test.  + head thrust to left, suggesting left vestibular hypofunction.  Initiated x1 gaze stabillity exercises.  Patient able to perform exercise side to side 20 seconds with 6/10 dizziness, 57 seconds with 7/10 dizziness.  Encouraged to perform 3 reps side to side and 1 rep up and down 5 x daily in sitting.  Then PT will progress to standing when patient is ready.   Transfers Transfers: Sit to Stand;Stand to Sit Sit to Stand: 4: Min assist;From bed Stand to Sit: 4: Min assist;To chair/3-in-1 Details for Transfer Assistance: Pt did a good job focusing on A in the walker during transitions as instructed by PT       Exercise     Balance Static Standing Balance Static Standing - Balance Support: Bilateral upper extremity supported;During functional activity Static Standing - Level of Assistance: 4: Min assist Static Standing - Comment/# of Minutes: 2 minutes with pt unsteady unless holding a device.     End of Session OT - End of Session Patient left: in chair;with call bell/phone within reach;with family/visitor present  GO     Jadis Mika, Metro Kung 11/02/2011, 1:55 PM

## 2011-11-03 LAB — URINE CULTURE
Colony Count: NO GROWTH
Culture: NO GROWTH

## 2011-11-03 MED ORDER — OMEPRAZOLE 20 MG PO CPDR: 20.0000 mg | DELAYED_RELEASE_CAPSULE | Freq: Every day | ORAL | Status: DC

## 2011-11-03 NOTE — Progress Notes (Signed)
Patient discharge teaching given, including activity, diet, follow-up appoints, and medications. Patient verbalized understanding of all discharge instructions. IV access was d/c'd. Vitals are stable. Skin is intact except as charted in most recent assessments. Pt to be escorted out by NT, to be driven home by family. Pt to receive front wheel walker before she leaves.

## 2011-11-03 NOTE — Care Management Note (Signed)
    Page 1 of 1   11/03/2011     2:22:07 PM   CARE MANAGEMENT NOTE 11/03/2011  Patient:  [name] [name]   Account Number:  [id number]  Date Initiated:  11/02/2011  Documentation initiated by:  Darlyne Russian  Subjective/Objective Assessment:   Patient admitted with vertigo     Action/Plan:   Progression of care and discharge planning   Anticipated DC Date:  11/03/2011   Anticipated DC Plan:  HOME/SELF CARE      DC Planning Services  CM consult      Choice offered to / List presented to:     DME arranged  Levan Hurst      DME agency  [facility]        Status of service:  Completed, signed off Medicare Important Message given?   (If response is "NO", the following Medicare IM given date fields will be blank) Date Medicare IM given:   Date Additional Medicare IM given:    Discharge Disposition:  HOME/SELF CARE  Per UR Regulation:  Reviewed for med. necessity/level of care/duration of stay  If discussed at Long Length of Stay Meetings, dates discussed:    Comments:  11/03/11 14:21 Letha Cape RN, BSN [phone] patient for dc today, rolling walker already ordered, [facility] notified, rolling walker brought to patient's room.  11/02/2011  [address] RN, [place]  [phone] [facility] called with referral for RW.

## 2011-11-03 NOTE — Progress Notes (Signed)
Physical Therapy Treatment Patient Details Name: [name] [name] MRN: [id number] DOB: 03/29/1990 Today's Date: 11/03/2011 Time: 0915-0950 PT Time Calculation (min): 35 min  PT Assessment / Plan / Recommendation Comments on Treatment Session  Patient s/p vertigo with decr mobility secondary to incr dizziness with mobility.  Patient states that her sister will be there to help her at home.  Patient agrees to use RW with target "A" at all times on d/c.  Outpt. PT for vestibular Rehab recommended as well.  Pt. agrees re: recommendations.      Follow Up Recommendations  Outpatient PT;Supervision - Intermittent    Barriers to Discharge        Equipment Recommendations  Rolling walker with 5" wheels    Recommendations for Other Services    Frequency Min 3X/week   Plan Discharge plan remains appropriate;Frequency remains appropriate    Precautions / Restrictions Precautions Precautions: Fall Restrictions Weight Bearing Restrictions: No   Pertinent Vitals/Pain VSS, No pain    Mobility  Bed Mobility Bed Mobility: Supine to Sit Supine to Sit: 7: Independent Transfers Transfers: Sit to Stand;Stand to Sit Sit to Stand: 5: Supervision;With upper extremity assist;From bed Stand to Sit: 5: Supervision;With upper extremity assist;To chair/3-in-1;With armrests Details for Transfer Assistance: Pt continues to use the "A" target during transitional movements without cuing or assist to stabilize herself.   Ambulation/Gait Ambulation/Gait Assistance: 5: Supervision Ambulation Distance (Feet): 400 Feet Assistive device: Rolling walker Ambulation/Gait Assistance Details: Ambulates using target "A" on RW and does not need cues to focus on it for stability.  Did not lose balance today while ambulating and demonstrates good safety with RW with target "A".  Patient doing well utilizing strategies for mobility.   Gait Pattern: Step-through pattern;Decreased stride length Gait velocity: decreased General  Gait Details: Less ataxic today.  PAtient very deliberate with steps but good safety overall.   Stairs: No Corporate treasurer: No        PT Goals Acute Rehab PT Goals PT Goal: Supine/Side to Sit - Progress: Met PT Goal: Sit to Stand - Progress: Progressing toward goal PT Goal: Stand - Progress: Progressing toward goal PT Goal: Ambulate - Progress: Progressing toward goal PT Goal: Perform Home Exercise Program - Progress: Progressing toward goal Additional Goals PT Goal: Additional Goal #1 - Progress: Progressing toward goal  Visit Information  Last PT Received On: 11/03/11 Assistance Needed: +1    Subjective Data  Subjective: "I can do well with the RW."   Cognition  Overall Cognitive Status: Appears within functional limits for tasks assessed/performed Arousal/Alertness: Awake/alert Orientation Level: Appears intact for tasks assessed Behavior During Session: [facility] for tasks performed    Balance  Static Standing Balance Static Standing - Balance Support: Bilateral upper extremity supported;During functional activity Static Standing - Level of Assistance: 6: Modified independent (Device/Increase time) Static Standing - Comment/# of Minutes: 2 minutes with no unsteadiness.  Performed x1 exercises in sitting for close to 1 minute x2 with good technique and moderate dizziness.  Then patient stood with RW and performed x1 exercise with min guard assist for about 28 seconds.  PAtient became very dizzy and needed to sit down but maintained balance.  Encouraged patient to continue standing exercises with guarding by familly and continue sitting exercises 5 x day on her own.  Pt. agrees.    End of Session PT - End of Session Equipment Utilized During Treatment: Gait belt Activity Tolerance: Patient tolerated treatment well Patient left: in chair;with call bell/phone within reach Nurse  Communication: Mobility status       INGOLD,[name] [name] 11/03/2011, 10:31 AM  [facility] [phone] (pager)

## 2011-11-03 NOTE — Discharge Summary (Signed)
Physician Discharge Summary  Patient ID: [name] [name] MRN: [id number] DOB/AGE: 1990-05-18 21 y.o.  Admit date: 10/31/2011 Discharge date: 11/03/2011  PCP: Gweneth Dimitri, MD  DISCHARGE DIAGNOSES:  Active Problems:  Vertigo  Labyrinthitis  Seizure disorder  Asthma  Nausea and vomiting    DISCHARGE CONDITION: fair  INITIAL HISTORY: This is a 21 year old, Caucasian female, with a past medical history of seizure disorder, which was diagnosed one year ago. She also has a history of asthma. She presented to the hospital with complaints of abdominal pain, nausea, vomiting, and dizziness, which started on Wednesday. She mentioned that she initially started having nausea and vomiting and then on Thursday morning she started having dizziness, which she describes as if the room was spinning around her. She says she's passed out a few times because of this. She has had some chills but denied any fever. She's never had similar symptoms in the past. Denied any recent medication changes. She's been having some pain in the left ear for the last few days. Also admitted to a ringing sensation in the ear. Denied any ear discharge. The dizziness is worse with movement. And that exacerbates her nausea and vomiting. She just finished her menstrual period. She denied being on oral contraceptives. She's not any in any sexual relationship at this time. After receiving multiple medications in the ED, patient felt better.  HOSPITAL COURSE:   Vertigo  Patient symptoms were most consistent with vertigo most likely secondary to labyrinthitis. She was started on Meclizine, valium and anti-emetics. She was also given benadryl bases on Neurology input. She has shown improvement with this regimen. She has been seen by PT and OT. She did better today with ambulation. She will need a walker for a few days. Outpatient vestibular rehab may be beneficial as well. She understands the disease process and is agreeable with plan. She  underwent MRI Brain and MRA head and neck to rule out other etiologies and these tests were unremarkable.  Abdominal pain/N/V  Likely from vertigo. The pain is probably from multiple episodes of vomiting. This has resolved. Will treat with PPI for 2 weeks. LFT and Lipase was normal. Urine pregnancy test was negative.   History of seizure disorder  Continue with the Trileptal. Seizure precautions was utilized. She was seen by neurology as well. The vertigo was thought to be unrelated to the seizure disorder. She can follow up with her neurologist at [facility].  History of Asthma  Continue with albuterol as needed. Continue with Singulair.   Abnormal UA without symptoms  She was afebrile. Urine culture is pending but there was no clear indication for treatment.  She is stable today. Her abdominal pain has resolved. All instructions provided regarding safety. She will follow up with her PCP. Ok for discharge.  IMAGING STUDIES Mr Shirlee Latch WU Contrast  11/30/2011  *RADIOLOGY REPORT*  Clinical Data:  Vertigo.  Seizures.  MRA HEAD WITHOUT CONTRAST  Technique:  Angiographic images of the Circle of Willis were obtained using MRA technique without intravenous contrast.  Comparison:  MRI brain 11-30-11.  Findings:  The internal carotid arteries are within normal limits from high cervical segments through the ICA termini.  The A1 and M1 segments are normal.  The anterior communicating artery is patent. The ACA and MCA branch vessels are within normal limits bilaterally.  The left vertebral artery is the dominant vessel.  The PICA origins are visualized and within normal limits bilaterally.  The basilar artery is normal.  Both posterior  cerebral arteries originate from basilar tip.  The PCA branch vessels are unremarkable.  IMPRESSION: Normal MRA circle of Willis.  No significant proximal stenosis, aneurysm, or branch vessel occlusion.  *RADIOLOGY REPORT*  MRA NECK WITHOUT AND WITH CONTRAST  Technique:   Angiographic images of the neck were obtained using MRA technique without and with intravenous contrast.  Carotid stenosis measurements (when applicable) are obtained utilizing NASCET criteria, using the distal internal carotid diameter as the denominator.  Contrast: 10mL MULTIHANCE GADOBENATE DIMEGLUMINE 529 MG/ML IV SOLN  Findings:  The time-of-flight images demonstrate no significant flow disturbance at either carotid bifurcation.  Flow is antegrade in the vertebral arteries bilaterally.  A standard three-vessel arch configuration is present.  The vertebral arteries originate from the subclavian arteries bilaterally.  The left vertebral artery is dominant.  There is no significant stenosis.  The common carotid arteries and carotid artery bifurcations are within normal limits bilaterally.  The internal carotid arteries are straight and normal bilaterally.  IMPRESSION: Negative MRA of the neck.   Original Report Authenticated By: Jamesetta Orleans. MATTERN, M.D.    Mr Angiogram Neck W Wo Contrast  11/01/2011  *RADIOLOGY REPORT*  Clinical Data:  Vertigo.  Seizures.  MRA HEAD WITHOUT CONTRAST  Technique:  Angiographic images of the Circle of Willis were obtained using MRA technique without intravenous contrast.  Comparison:  MRI brain 11/01/2011.  Findings:  The internal carotid arteries are within normal limits from high cervical segments through the ICA termini.  The A1 and M1 segments are normal.  The anterior communicating artery is patent. The ACA and MCA branch vessels are within normal limits bilaterally.  The left vertebral artery is the dominant vessel.  The PICA origins are visualized and within normal limits bilaterally.  The basilar artery is normal.  Both posterior cerebral arteries originate from basilar tip.  The PCA branch vessels are unremarkable.  IMPRESSION: Normal MRA circle of Willis.  No significant proximal stenosis, aneurysm, or branch vessel occlusion.  *RADIOLOGY REPORT*  MRA NECK WITHOUT AND  WITH CONTRAST  Technique:  Angiographic images of the neck were obtained using MRA technique without and with intravenous contrast.  Carotid stenosis measurements (when applicable) are obtained utilizing NASCET criteria, using the distal internal carotid diameter as the denominator.  Contrast: 10mL MULTIHANCE GADOBENATE DIMEGLUMINE 529 MG/ML IV SOLN  Findings:  The time-of-flight images demonstrate no significant flow disturbance at either carotid bifurcation.  Flow is antegrade in the vertebral arteries bilaterally.  A standard three-vessel arch configuration is present.  The vertebral arteries originate from the subclavian arteries bilaterally.  The left vertebral artery is dominant.  There is no significant stenosis.  The common carotid arteries and carotid artery bifurcations are within normal limits bilaterally.  The internal carotid arteries are straight and normal bilaterally.  IMPRESSION: Negative MRA of the neck.   Original Report Authenticated By: Jamesetta Orleans. MATTERN, M.D.    Mr Brain Wo Contrast  11/01/2011  *RADIOLOGY REPORT*  Clinical Data: Ataxia.  History of epilepsy.  Headache.  Nausea and vomiting.  Ringing in the left ear.  MRI HEAD WITHOUT CONTRAST  Technique:  Multiplanar, multiecho pulse sequences of the brain and surrounding structures were obtained according to standard protocol without intravenous contrast.  Comparison: CT head without contrast 12/02/2010.  Findings: No acute intracranial abnormalities are present. Specifically, there is no evidence for acute infarct, hemorrhage, mass, hydrocephalus, or significant extra-axial fluid collection. Flow is present in the major intracranial arteries.  The globes and orbits are intact.  The paranasal sinuses and mastoid air cells are clear.  IMPRESSION: Negative MRI of the brain.   Original Report Authenticated By: Jamesetta Orleans. MATTERN, M.D.     DISCHARGE EXAMINATION: Blood pressure 114/75, pulse 72, temperature 98.2 F (36.8 C),  temperature source Oral, resp. rate 18, height 5\' 4"  (1.626 m), weight 49.8 kg (109 lb 12.6 oz), SpO2 97.00%. General appearance: alert, cooperative, appears stated age and no distress Head: Normocephalic, without obvious abnormality, atraumatic Resp: clear to auscultation bilaterally Cardio: regular rate and rhythm, S1, S2 normal, no murmur, click, rub or gallop GI: soft, non-tender; bowel sounds normal; no masses,  no organomegaly Neurologic: Grossly normal  DISPOSITION: Home  Discharge Orders    Future Orders Please Complete By Expires   Diet general      Increase activity slowly      Comments:   Go for vestibular rehab as recommended   Discharge instructions      Comments:   Be sure to follow up with your PCP.     Current Discharge Medication List    START taking these medications   Details  diazepam (VALIUM) 5 MG tablet Take 1 tablet 3 times per day (every 8 hours) for 2 days and then as needed. Qty: 12 tablet, Refills: 0    meclizine (ANTIVERT) 25 MG tablet Take 1 tablet 3 times a day (every 8 hours) for 3 days and then as needed Qty: 30 tablet, Refills: 0    omeprazole (PRILOSEC) 20 MG capsule Take 1 capsule (20 mg total) by mouth daily. Qty: 14 capsule, Refills: 0    promethazine (PHENERGAN) 12.5 MG tablet Take 1 tablet (12.5 mg total) by mouth every 6 (six) hours as needed for nausea. Qty: 15 tablet, Refills: 0    UNABLE TO FIND Outpatient PT for vestibular rehabilitation.  (May call [phone] for appointment or choose any other facility) Qty: 1 Units, Refills: 0      CONTINUE these medications which have NOT CHANGED   Details  albuterol (PROVENTIL) (2.5 MG/3ML) 0.083% nebulizer solution Take 2.5 mg by nebulization every 6 (six) hours as needed. For shortness of breath    Oxcarbazepine (TRILEPTAL) 300 MG tablet Take 600-750 mg by mouth See admin instructions. Takes 2 tablets in the morning, and 2.5 tablets at night.    albuterol (PROVENTIL HFA;VENTOLIN HFA)  108 (90 BASE) MCG/ACT inhaler Inhale 2 puffs into the lungs every 6 (six) hours as needed. For asthma symptoms    montelukast (SINGULAIR) 10 MG tablet Take 10 mg by mouth at bedtime.      STOP taking these medications     aspirin EC 325 MG tablet      Cetirizine HCl (KLS ALLER-TEC PO)        Follow-up Information    Follow up with Dr. Junious Dresser. Schedule an appointment as soon as possible for a visit in 3 days. (post hospitalization follow up)          TOTAL DISCHARGE TIME: 35 mins  [facility]  [facility] Pager [phone]  11/03/2011, 11:06 AM

## 2013-02-15 ENCOUNTER — Encounter: Payer: Self-pay | Admitting: Cardiology

## 2013-02-16 ENCOUNTER — Encounter: Payer: Self-pay | Admitting: Cardiology

## 2013-02-16 ENCOUNTER — Ambulatory Visit (INDEPENDENT_AMBULATORY_CARE_PROVIDER_SITE_OTHER): Payer: BC Managed Care – PPO | Admitting: Cardiology

## 2013-02-16 ENCOUNTER — Encounter: Payer: Self-pay | Admitting: *Deleted

## 2013-02-16 VITALS — BP 110/70 | HR 72 | Wt 113.0 lb

## 2013-02-16 DIAGNOSIS — R42 Dizziness and giddiness: Secondary | ICD-10-CM

## 2013-02-16 DIAGNOSIS — R55 Syncope and collapse: Secondary | ICD-10-CM

## 2013-02-16 DIAGNOSIS — R079 Chest pain, unspecified: Secondary | ICD-10-CM | POA: Insufficient documentation

## 2013-02-16 DIAGNOSIS — I498 Other specified cardiac arrhythmias: Secondary | ICD-10-CM

## 2013-02-16 DIAGNOSIS — R000 Tachycardia, unspecified: Secondary | ICD-10-CM

## 2013-02-16 DIAGNOSIS — I951 Orthostatic hypotension: Secondary | ICD-10-CM

## 2013-02-16 LAB — BASIC METABOLIC PANEL
CO2: 33 mEq/L — ABNORMAL HIGH (ref 19–32)
Calcium: 9.6 mg/dL (ref 8.4–10.5)
Chloride: 101 mEq/L (ref 96–112)
Glucose, Bld: 80 mg/dL (ref 70–99)
Potassium: 4.2 mEq/L (ref 3.5–5.1)
Sodium: 138 mEq/L (ref 135–145)

## 2013-02-16 MED ORDER — POTASSIUM CHLORIDE CRYS ER 20 MEQ PO TBCR: 20.0000 meq | EXTENDED_RELEASE_TABLET | Freq: Every day | ORAL | Status: DC

## 2013-02-16 NOTE — Progress Notes (Signed)
[address] [place], [place]  [address] Phone: [phone] Fax:  [phone]  Date:  02/16/2013   ID:  [name] [name], DOB [birth year], MRN [id number]  PCP:  Gweneth Dimitri, MD   History of Present Illness: [name] [name] is a 22 y.o. female with seizure disorder here for followup of orthostatic hypotension. she has started on Florinef 0.2 milligrams. This was increased from 0.1 mg. This was done on 05/25/12 we discussed the possibility of pots. My goal is to taper off medications in the future. Prior lab work reviewed. Sodium 137, potassium 3.6. Echocardiogram with mild mitral valve prolapse, normal EF. 24 Holter monitor with occasional PVC/couplets. No adverse arrhythmias.  Previously, she has had symptoms recently worsening of palpitations, heart racing, feeling faint with tidal vision like she is going to pass out. They can occur several times a day with or without exertion. She is trying to drink Gatorade, water, orange juice making sure that she is eating throughout the day but her symptoms continued to persist. Lab work including TSH, CBC, basic metabolic profile are normal. She is underweight with her BMI being 18. I agree with possibility of postural orthostatic tachycardia syndrome. Feels improved on higher dose Fludrocortisone 0.2mg . Also Gatorade has helped. She notices significant change if she does not take her Gatorade.She is also gained approximately 4 pounds.  09/15/12-she is doing very well on current drug regimen. No further syncope. She wishes to continue currently. 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 One occurred while she was in the elevator at school, prodrome of tachycardia, weakness. Went to the emergency room where she was found to have a potassium of approximately 3. She was given potassium supplementation and sent home. She then had  another episode last weekend with profound weakness, went to the emergency room again and was found to have a potassium of 2.5. Dr. Uvaldo Rising at check a potassium level on 11/17 and it was normal. I have check potassium levels routinely after starting and increasing Florinef and they were normal as well. She had a viral illness, weight loss. Would like to undergo tonsillectomy because of frequent infections. Dr. Ezzard Standing. I think that this is reasonable as long as her potassium is fine. We will check.      Wt Readings from Last 3 Encounters:  02/16/13 113 lb (51.256 kg)  11/02/11 109 lb 12.6 oz (49.8 kg)     Past Medical History  Diagnosis Date  . Asthma   . Seizures     No past surgical history on file.  Current Outpatient Prescriptions  Medication Sig Dispense Refill  . albuterol (PROVENTIL HFA;VENTOLIN HFA) 108 (90 BASE) MCG/ACT inhaler Inhale into the lungs every 6 (six) hours as needed for wheezing or shortness of breath.      . DULoxetine (CYMBALTA) 60 MG capsule Take 60 mg by mouth daily.      . montelukast (SINGULAIR) 10 MG tablet Take 10 mg by mouth at bedtime.       No current facility-administered medications for this visit.    Allergies:    Allergies  Allergen Reactions  . Amoxicillin Rash    Social History:  The patient  reports that she has never smoked. She does not have any smokeless tobacco history on file. She reports that she drinks alcohol. She reports that she does not use illicit drugs.  ROS:  Please see the history of present illness.   Denies any bleeding, strokelike symptoms, orthopnea, PND  PHYSICAL EXAM: VS:  BP 110/70  Pulse 72  Wt 113 lb (51.256 kg) Thin,  in no acute distress HEENT: normal Neck: no JVD Cardiac:  normal S1, S2; RRR; no murmur Lungs:  clear to auscultation bilaterally, no wheezing, rhonchi or rales Abd: soft, nontender, no hepatomegaly Ext: no edema Skin: warm and dry Neuro: no focal abnormalities noted  EKG:      Prior  Holter monitor with PVCs, benign Echocardiogram with normal ejection fraction  ASSESSMENT AND PLAN:  1. Orthostatic hypotension/syncope-Florinef therapy. After showing stability on current dose of Florinef, she ended up having hypokalemia on 2 separate occasions. A viral illness likely precipitated this as well as Florinef which can cause hypokalemia. I will go ahead and place her on standing potassium supplementation 20 mEq daily. I will check blood work today and in 2 weeks before she goes back to school at [employer]. She was doing quite well with her current drug dosage and regimen. I would like to try potassium supplementation with Florinef at 0.2 mg before decreasing Florinef dosing. We will need to have very close monitoring of her potassium levels. I can write her a standing order so that she can get this done at [facility] as well.  " POTS place." website. Encouraged weight gain, salt liberalization, liberalization, compression. Mother present for discussion. 2. Preoperative stratification-she is to undergo tonsillectomy by Dr. Ezzard Standing on 12/29. I do believe that she may proceed with the surgery as long as her potassium is in good order. If her blood pressure becomes low, volume resuscitation will be helpful. 3. Over 25 minutes spent in discussion.  Signed, Donato Schultz, MD [facility]  02/16/2013 11:51 AM

## 2013-02-16 NOTE — Patient Instructions (Addendum)
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 Start Potassium 20 meq 1 tab daily

## 2013-03-04 ENCOUNTER — Encounter (HOSPITAL_COMMUNITY): Payer: Self-pay | Admitting: Emergency Medicine

## 2013-03-04 ENCOUNTER — Emergency Department (HOSPITAL_COMMUNITY)
Admission: EM | Admit: 2013-03-04 | Discharge: 2013-03-04 | Disposition: A | Discharge status: Home / Self Care | Payer: BC Managed Care – PPO | Attending: Emergency Medicine | Admitting: Emergency Medicine

## 2013-03-04 DIAGNOSIS — Y838 Other surgical procedures as the cause of abnormal reaction of the patient, or of later complication, without mention of misadventure at the time of the procedure: Secondary | ICD-10-CM | POA: Insufficient documentation

## 2013-03-04 DIAGNOSIS — J45909 Unspecified asthma, uncomplicated: Secondary | ICD-10-CM | POA: Insufficient documentation

## 2013-03-04 DIAGNOSIS — R569 Unspecified convulsions: Secondary | ICD-10-CM | POA: Insufficient documentation

## 2013-03-04 DIAGNOSIS — Z79899 Other long term (current) drug therapy: Secondary | ICD-10-CM | POA: Insufficient documentation

## 2013-03-04 DIAGNOSIS — IMO0002 Reserved for concepts with insufficient information to code with codable children: Secondary | ICD-10-CM | POA: Insufficient documentation

## 2013-03-04 LAB — CBC WITH DIFFERENTIAL/PLATELET
BASOS ABS: 0 10*3/uL (ref 0.0–0.1)
BASOS PCT: 0 % (ref 0–1)
EOS PCT: 4 % (ref 0–5)
Eosinophils Absolute: 0.3 10*3/uL (ref 0.0–0.7)
HCT: 37.6 % (ref 36.0–46.0)
Hemoglobin: 13.1 g/dL (ref 12.0–15.0)
LYMPHS PCT: 23 % (ref 12–46)
Lymphs Abs: 1.7 10*3/uL (ref 0.7–4.0)
MCH: 29 pg (ref 26.0–34.0)
MCHC: 34.8 g/dL (ref 30.0–36.0)
MCV: 83.4 fL (ref 78.0–100.0)
Monocytes Absolute: 0.7 10*3/uL (ref 0.1–1.0)
Monocytes Relative: 9 % (ref 3–12)
Neutro Abs: 4.7 10*3/uL (ref 1.7–7.7)
Neutrophils Relative %: 63 % (ref 43–77)
PLATELETS: 243 10*3/uL (ref 150–400)
RBC: 4.51 MIL/uL (ref 3.87–5.11)
RDW: 12.9 % (ref 11.5–15.5)
WBC: 7.4 10*3/uL (ref 4.0–10.5)

## 2013-03-04 LAB — COMPREHENSIVE METABOLIC PANEL
ALBUMIN: 4 g/dL (ref 3.5–5.2)
ALT: 22 U/L (ref 0–35)
AST: 25 U/L (ref 0–37)
Alkaline Phosphatase: 70 U/L (ref 39–117)
BUN: 10 mg/dL (ref 6–23)
CALCIUM: 9.2 mg/dL (ref 8.4–10.5)
CO2: 30 meq/L (ref 19–32)
Chloride: 96 mEq/L (ref 96–112)
Creatinine, Ser: 0.64 mg/dL (ref 0.50–1.10)
GFR calc Af Amer: >90 mL/min (ref >90)
Glucose, Bld: 98 mg/dL (ref 70–99)
Potassium: 3.8 mEq/L (ref 3.7–5.3)
SODIUM: 136 meq/L — AB (ref 137–147)
TOTAL PROTEIN: 7.9 g/dL (ref 6.0–8.3)
Total Bilirubin: 0.4 mg/dL (ref 0.3–1.2)

## 2013-03-04 NOTE — ED Notes (Signed)
Per pt sts tonsil surgery on Monday and unable to get bleeding under control. sts used ice and layed down like told but not getting better. sts 2 days.

## 2013-03-04 NOTE — Discharge Summary (Signed)
1. DC today with mother. °2. Quiet indoor activity x 3 days. Keep head of bed elevated 30 degrees for 3 nights. °3. Soft diet x 1 wk. °4. Finish zithromax and continue on home pain medications. °5. Return to the ED for any further bleeding. °6. Contact Dr. Newman on Monday, Jan. 5, 2015 for follow-up. °7. DC status stable. ° °  1. DC today with mother. 2. Quiet indoor activity x 3 days. Keep head of bed elevated 30 degrees for 3 nights. 3. Soft diet x 1 wk. 4. Finish zithromax and continue on home pain medications. 5. Return to the ED for any further bleeding. 6. Contact Dr. Ezzard Standing on Monday, Jan. 5, 2015 for follow-up. 7. DC status stable.

## 2013-03-04 NOTE — ED Provider Notes (Signed)
Pt was met in the er by Dr. Kraus from ent and he took care of the pt   , NP 03/04/13 1944 

## 2013-03-04 NOTE — Discharge Summary (Signed)
  The patient was treated in [facility] ED for  L postoperative T&A bleeding after undergoing a T&A by Dr. Narda Bondshris Newman on 02-27-13. The patient was found to be bleeding from the L superior tonsillar fossa. Bleeding was controlled without difficulty with topical Hurricane spray anesthesia and AgNO3 cauterization. She was rehydrated with 1000ml of NS IV. CBC and CMP were unremarkable.  The patient tolerated the procedure well and was discharged home with her mother, Joyice FasterKay Jones. The mother is to contiunue home antibiotics and pain medication, soft diet x 1 wk, head elevation x 3 days, and contact Dr. Myrtis Hoppinghris Newman's office for a follow-up appointment on 03-06-13. She is to return the patient to [facility] ED for any further bleeding.   The patient was discharged in stable condition.

## 2013-03-04 NOTE — ED Provider Notes (Signed)
Medical screening examination/treatment/procedure(s) were performed by non-physician practitioner and as supervising physician I was immediately available for consultation/collaboration.  EKG Interpretation   None         Sahmya Arai, MD 03/04/13 1945 

## 2013-03-04 NOTE — Discharge Instructions (Signed)
1. DC today with mother. 2. Quiet indoor activity x 3 days. Keep head of bed elevated 30 degrees for 3 nights. 3. Soft diet x 1 wk. 4. Finish zithromax and continue on home pain medications. 5. Return to the ED for any further bleeding. 6. Contact Dr. Ezzard StandingNewman on Monday, Jan. 5, 2015 for follow-up. 7. DC status stable.

## 2013-03-04 NOTE — Consult Note (Signed)
Otolaryngology Consultation in the ED, Room 4438  S: The patient is a 23 y/o WF, here today with her mother, [name] [name], for evaluation of a postoperative tonsillectomy hemorrhage after undergoing a T&A by Dr. Narda Bondshris Newman on 02-27-13. The patient was well until 11 am this morning when she spontaneously bled from her L tonsillar fossa. The mother contacted me by answering service as I was on call for Dr. Ezzard StandingNewman. It was not possible to quantitate the amount of blood loss.  The patient last ate a small amount of applesauce at 11am. She has been taking zithromax, tylenol with codeine and ibuprofen. She is allergic to amoxicillin and has a history of fluctuating blood pressure.  O: In the ED, the patient was found to have some flat clot filling the L tonsillar fossa. The clot was evacuated with a Frazier suction and the fossa was anesthetized with topical Hurricane spray x 4. There was not active bleeding but clot in the superior pole. The superior pole was cauterized with topical AgNO3. The patient tolerated the procedure well and without complaint.  During the procedure, the patient was given 1000ml of NS. A CBC, CMP were drawn and sent to the lab.  The R tonsillar fossa was covered by white eschar. No clots or bleeding present.  A: 1. Postoperative tonsillectomy bleeding from the L superior tonsillar fossa, not arterial. 2. The patient is stable and does not require electrocauterization in the OR. 3. Patient was rehydrated in the ED (1000 ml of NS IV)  P: 1. DC today with mother. 2. Quiet indoor activity x 3 days. Keep head of bed elevated 30 degrees for 3 nights. 3. Soft diet x 1 wk. 4. Finish zithromax and continue on home pain medications. 5. Return to the ED for any further bleeding. 6. Contact Dr. Ezzard StandingNewman on Monday, Jan. 5, 2015 for follow-up. 7. DC status stable.

## 2013-03-06 ENCOUNTER — Other Ambulatory Visit: Payer: BC Managed Care – PPO

## 2013-03-16 ENCOUNTER — Ambulatory Visit: Payer: BC Managed Care – PPO | Admitting: Cardiology

## 2013-04-12 ENCOUNTER — Telehealth: Payer: Self-pay | Admitting: Cardiology

## 2013-04-12 NOTE — Telephone Encounter (Signed)
New message ° ° ° ° °Want lab results °

## 2013-05-16 ENCOUNTER — Ambulatory Visit: Payer: BC Managed Care – PPO | Admitting: Cardiology

## 2013-07-05 ENCOUNTER — Other Ambulatory Visit: Payer: Self-pay | Admitting: Cardiology

## 2013-11-15 ENCOUNTER — Telehealth: Payer: Self-pay | Admitting: Cardiology

## 2013-11-15 NOTE — Telephone Encounter (Signed)
New message          Pt would like to be referred to a cardiologist closer to her school out of town / Can you help with this

## 2013-11-15 NOTE — Telephone Encounter (Signed)
Spoke with pt who reports 3 or 4 times in the last 2 weeks her heart has started racing and she becomes very lightheaded and feels like she is going to pass out.  Her HR has been too fast for her to count.  She also reports her BP drops though she doesn't have a reading to give to me.  She remains on medications as listed and states she is eating and drinking well.  She is away at school (4 hours area) and would like to know if Dr Anne Fu knows of a cardiologist closer to where she is that she should see.  She in living in [place], [place].  Area I will ask Dr Anne Fu and call her back with any recommendations.

## 2013-11-17 NOTE — Telephone Encounter (Signed)
Sorry to hear this. [facility] cardiology has a practice in [place]. I would recommend giving their office a call.  Donato Schultz, MD

## 2013-11-17 NOTE — Telephone Encounter (Signed)
Left message for pt to call [facility] and requested she call back with further questions.

## 2013-11-23 ENCOUNTER — Ambulatory Visit (INDEPENDENT_AMBULATORY_CARE_PROVIDER_SITE_OTHER): Payer: BC Managed Care – PPO | Admitting: Cardiology

## 2013-11-23 ENCOUNTER — Encounter: Payer: Self-pay | Admitting: Cardiology

## 2013-11-23 VITALS — BP 98/82 | HR 77 | Ht 64.0 in | Wt 120.2 lb

## 2013-11-23 DIAGNOSIS — G90A Postural orthostatic tachycardia syndrome (POTS): Secondary | ICD-10-CM | POA: Insufficient documentation

## 2013-11-23 DIAGNOSIS — I498 Other specified cardiac arrhythmias: Secondary | ICD-10-CM | POA: Insufficient documentation

## 2013-11-23 DIAGNOSIS — I951 Orthostatic hypotension: Principal | ICD-10-CM

## 2013-11-23 DIAGNOSIS — R002 Palpitations: Secondary | ICD-10-CM

## 2013-11-23 DIAGNOSIS — R000 Tachycardia, unspecified: Principal | ICD-10-CM

## 2013-11-23 NOTE — Progress Notes (Signed)
[address] [place], [place]  [address] Phone: [phone] Fax:  [phone]  Date:  11/23/2013   ID:  Demika Langenderfer, DOB October 04, 1990, MRN [id number]  PCP:  Gweneth Dimitri, MD   History of Present Illness: [name] [name] is a 23 y.o. female with seizure disorder here for followup of orthostatic hypotension. she has started on Florinef 0.2 milligrams. This was increased from 0.1 mg. This was done on 05/25/12 we discussed the possibility of pots. My goal is to taper off medications in the future. Prior lab work reviewed. Sodium 137, potassium 3.6. Echocardiogram with mild mitral valve prolapse, normal EF. 24 Holter monitor with occasional PVC/couplets. No adverse arrhythmias.  Previously, she has had symptoms recently worsening of palpitations, heart racing, feeling faint with tidal vision like she is going to pass out. They can occur several times a day with or without exertion. She is trying to drink Gatorade, water, orange juice making sure that she is eating throughout the day but her symptoms continued to persist. Lab work including TSH, CBC, basic metabolic profile are normal. She is underweight with her BMI being 18. I agree with possibility of postural orthostatic tachycardia syndrome. Feels improved on higher dose Fludrocortisone 0.2mg . Also Gatorade has helped. She notices significant change if she does not take her Gatorade.She is also gained approximately 4 pounds.  09/15/12-she is doing very well on current drug regimen. No further syncope. She wishes to continue currently. 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 One occurred while she was in the elevator at school, prodrome of tachycardia, weakness. Went to the emergency room where she was found to have a potassium of approximately 3. She was given potassium supplementation and sent home. She then had  another episode last weekend with profound weakness, went to the emergency room again and was found to have a potassium of 2.5. Dr. Uvaldo Rising at check a potassium level on 11/17 and it was normal. I have check potassium levels routinely after starting and increasing Florinef and they were normal as well. She had a viral illness, weight loss. Would like to undergo tonsillectomy because of frequent infections. Dr. Ezzard Standing. I think that this is reasonable as long as her potassium is fine. We will check.    11/23/13-had an episode that landed her in the emergency room last night. Electronically normal. She has had periods of hypokalemia in the past. See below for details.    Wt Readings from Last 3 Encounters:  11/23/13 120 lb 3.2 oz (54.522 kg)  03/04/13 117 lb (53.071 kg)  02/16/13 113 lb (51.256 kg)     Past Medical History  Diagnosis Date  . Asthma   . Seizures   . Chest pain     Past Surgical History  Procedure Laterality Date  . Tonsillectomy      Current Outpatient Prescriptions  Medication Sig Dispense Refill  . albuterol (PROVENTIL HFA;VENTOLIN HFA) 108 (90 BASE) MCG/ACT inhaler Inhale into the lungs every 6 (six) hours as needed for wheezing or shortness of breath.      . DULoxetine (CYMBALTA) 60 MG capsule Take 60 mg by mouth daily.      . fludrocortisone (FLORINEF) 0.1 MG tablet TAKE 2 TABLETS BY MOUTH ONCE DAILY.  60 tablet  2  . montelukast (SINGULAIR) 10 MG tablet Take 10 mg by mouth at bedtime.      Marland Kitchen  potassium chloride SA (KLOR-CON M20) 20 MEQ tablet Take 1 tablet (20 mEq total) by mouth daily.  30 tablet  3  . zonisamide (ZONEGRAN) 100 MG capsule Take 300 mg by mouth at bedtime.       No current facility-administered medications for this visit.    Allergies:    Allergies  Allergen Reactions  . Penicillins   . Amoxicillin Rash    Social History:  The patient  reports that she has never smoked. She does not have any smokeless tobacco history on file. She reports that  she drinks alcohol. She reports that she does not use illicit drugs.   ROS:  Please see the history of present illness.   Denies any bleeding, strokelike symptoms, orthopnea, PND  PHYSICAL EXAM: VS:  BP 98/82  Pulse 77  Ht  (1.626 m)  Wt 120 lb 3.2 oz (54.522 kg)  BMI 20.62 kg/m2 Thin,  in no acute distress HEENT: normal Neck: no JVD Cardiac:  normal S1, S2; RRR; no murmur Lungs:  clear to auscultation bilaterally, no wheezing, rhonchi or rales Abd: soft, nontender, no hepatomegaly Ext: no edema Skin: warm and dry Neuro: no focal abnormalities noted  EKG:      Prior Holter monitor with PVCs, benign Echocardiogram with normal ejection fraction  ASSESSMENT AND PLAN:  1. Orthostatic hypotension/syncope/POTS-Florinef therapy. 87/53 on Sunday, 76/36. Vagal component, nausea. Went to hospital last night. All electrolytes normal. Previously, after showing stability on current dose of Florinef, she ended up having hypokalemia on 2 separate occasions. A viral illness likely precipitated this as well as Florinef which can cause hypokalemia. On standing potassium supplementation 20 mEq daily. She is in school at [employer]. She actually inquired about possibly seeing a cardiologist in [place]. This seems like a good idea given the close proximity. She has been doing quite well with her current drug dosage and regimen. I would like to try potassium supplementation with Florinef at 0.2 mg before decreasing Florinef dosing. We will need to have very close monitoring of her potassium levels. " POTS place." website. Encouraged weight gain, salt liberalization, liberalization, compression. Mother present for discussion. 2. Palpitations-SVT possibility. If these continue, monitor.     Signed, Donato Schultz, MD [facility]  11/23/2013 10:33 AM

## 2013-11-23 NOTE — Patient Instructions (Signed)
The current medical regimen is effective;  continue present plan and medications.  Follow up in 6 months with Dr. Skains.  You will receive a letter in the mail 2 months before you are due.  Please call us when you receive this letter to schedule your follow up appointment.  

## 2014-02-28 ENCOUNTER — Other Ambulatory Visit: Payer: Self-pay | Admitting: Cardiology

## 2014-03-02 ENCOUNTER — Emergency Department (INDEPENDENT_AMBULATORY_CARE_PROVIDER_SITE_OTHER): Payer: BC Managed Care – PPO

## 2014-03-02 ENCOUNTER — Encounter (HOSPITAL_COMMUNITY): Payer: Self-pay | Admitting: Emergency Medicine

## 2014-03-02 ENCOUNTER — Emergency Department (HOSPITAL_COMMUNITY)
Admission: EM | Admit: 2014-03-02 | Discharge: 2014-03-02 | Disposition: A | Discharge status: Home / Self Care | Payer: BC Managed Care – PPO | Source: Home / Self Care | Attending: Family Medicine | Admitting: Family Medicine

## 2014-03-02 DIAGNOSIS — S9032XA Contusion of left foot, initial encounter: Secondary | ICD-10-CM

## 2014-03-02 DIAGNOSIS — S86812A Strain of other muscle(s) and tendon(s) at lower leg level, left leg, initial encounter: Secondary | ICD-10-CM

## 2014-03-02 DIAGNOSIS — T148 Other injury of unspecified body region: Secondary | ICD-10-CM

## 2014-03-02 DIAGNOSIS — T148XXA Other injury of unspecified body region, initial encounter: Secondary | ICD-10-CM

## 2014-03-02 DIAGNOSIS — S8002XA Contusion of left knee, initial encounter: Secondary | ICD-10-CM

## 2014-03-02 NOTE — ED Provider Notes (Signed)
CSN: [id number]     Arrival date & time 03/02/14  1813 History   First MD Initiated Contact with Patient 03/02/14 1819     Chief Complaint  Patient presents with  . Leg Pain   HPI Comments: 24 year old female states that a course stepped on her left foot proximally 4:30 PM today. In addition she fell down and slid into a wall with her left knee. He is complaining of pain and swelling to the left knee as well as pain to the foot. She is able to bear partial weight for short distance. Denies other injury.   Past Medical History  Diagnosis Date  . Asthma   . Seizures   . Chest pain    Past Surgical History  Procedure Laterality Date  . Tonsillectomy     Family History  Problem Relation Age of Onset  . Thyroid disease Brother    History  Substance Use Topics  . Smoking status: Never Smoker   . Smokeless tobacco: Not on file  . Alcohol Use: Yes     Comment: rarely   OB History    No data available     Review of Systems  Constitutional: Positive for activity change. Negative for appetite change.  HENT: Negative.   Cardiovascular: Negative.   Musculoskeletal: Positive for joint swelling. Negative for neck pain and neck stiffness.  Neurological: Negative for dizziness, tremors, syncope, facial asymmetry, speech difficulty, light-headedness and headaches.    Allergies  Penicillins and Amoxicillin  Home Medications   Prior to Admission medications   Medication Sig Start Date End Date Taking? Authorizing Provider  albuterol (PROVENTIL HFA;VENTOLIN HFA) 108 (90 BASE) MCG/ACT inhaler Inhale into the lungs every 6 (six) hours as needed for wheezing or shortness of breath.    Historical Provider, MD  DULoxetine (CYMBALTA) 60 MG capsule Take 60 mg by mouth daily.    Historical Provider, MD  fludrocortisone (FLORINEF) 0.1 MG tablet TAKE 2 TABLETS BY MOUTH ONCE DAILY. NEED TO SCHEDULE APPOINTMENT FOR FURTHER REFILLS 02/28/14   Donato Schultz, MD  montelukast (SINGULAIR) 10 MG tablet  Take 10 mg by mouth at bedtime.    Historical Provider, MD  potassium chloride SA (KLOR-CON M20) 20 MEQ tablet Take 1 tablet (20 mEq total) by mouth daily. 02/16/13   Donato Schultz, MD  zonisamide (ZONEGRAN) 100 MG capsule Take 300 mg by mouth at bedtime. 10/13/13   Historical Provider, MD   BP 137/93 mmHg  Pulse 86  Temp(Src) 98.7 F (37.1 C) (Oral)  Resp 18  SpO2 98%  LMP 02/26/2014 Physical Exam  Constitutional: She is oriented to person, place, and time. She appears well-developed and well-nourished. No distress.  Neck: Normal range of motion. Neck supple.  Cardiovascular: Normal rate.   Pulmonary/Chest: Effort normal. No respiratory distress.  Musculoskeletal: She exhibits edema and tenderness.  Anteromedial tenderness with swelling to the left knee. Extension and flexion intact. Negative drawer. Negative varus valgus. Positive ecchymosis.  Left foot with swelling and ecchymosis to the dorsal aspect. Able to move toes. Distal neurovascular motor sensory is intact. Ankle range of motion is intact. No ankle tenderness.  Neurological: She is alert and oriented to person, place, and time. She exhibits normal muscle tone.  Skin: Skin is warm and dry.  Nursing note and vitals reviewed.   ED Course  Procedures Labs Review Labs Reviewed - No data to display  Imaging Review Dg Knee Complete 4 Views Left  03/02/2014   CLINICAL DATA:  Mashed left knee into metal  panels while trying to pull horse out. Medial left knee pain. Initial encounter.  EXAM: LEFT KNEE - COMPLETE 4+ VIEW  COMPARISON:  Left knee radiographs performed 11/30/2004  FINDINGS: There is no evidence of fracture or dislocation. The joint spaces are preserved. No significant degenerative change is seen; the patellofemoral joint is grossly unremarkable in appearance.  Trace fluid at the knee joint space remains within normal limits. Prominent soft tissue swelling is noted overlying the patellar tendon. Would correlate clinically to  exclude partial patellar tendon injury. Significant soft tissue swelling is also noted about the medial aspect of the knee.  IMPRESSION: 1. No evidence of fracture or dislocation. 2. Prominent soft tissue swelling overlying the patellar tendon, and about the medial aspect of the knee. Would correlate clinically to exclude partial patellar tendon injury.   Electronically Signed   By: Roanna Raider M.D.   On: 03/02/2014 19:02   Dg Foot Complete Left  03/02/2014   CLINICAL DATA:  A horse stepped on the patient's left foot earlier today, bruising and pain. Initial encounter.  EXAM: PORTABLE LEFT FOOT - COMPLETE 3+ VIEW  COMPARISON:  None.  FINDINGS: Dorsal soft tissue swelling. No evidence of acute fracture or dislocation. Joint spaces well preserved. Well-preserved bone mineral density. No intrinsic osseous abnormalities.  IMPRESSION: No osseous abnormality.   Electronically Signed   By: Hulan Saas M.D.   On: 03/02/2014 19:07     MDM   1. Knee contusion, left, initial encounter   2. Contusion   3. Patellar tendon strain, left, initial encounter   4. Foot contusion, left, initial encounter      Knee immobilizer ACE left foot Crutches, partial wt bearing as tolerated Ice Elevation Rest F/U with Dr. Lajoyce Corners for knee    Hayden Rasmussen, NP 03/02/14 1948

## 2014-03-02 NOTE — ED Notes (Signed)
Left foot and left knee injury.  Patient reports her horse stepped on her foot and then patient tried to pull foot aware and secure her footing in mud, but fell to her left knee.  Visible bruising.

## 2014-03-02 NOTE — Discharge Instructions (Signed)
Foot Contusion A foot contusion is a deep bruise to the foot. Contusions are the result of an injury that caused bleeding under the skin. The contusion may turn blue, purple, or yellow. Minor injuries will give you a painless contusion, but more severe contusions may stay painful and swollen for a few weeks. CAUSES  A foot contusion comes from a direct blow to that area, such as a heavy object falling on the foot. SYMPTOMS   Swelling of the foot.  Discoloration of the foot.  Tenderness or soreness of the foot. DIAGNOSIS  You will have a physical exam and will be asked about your history. You may need an X-ray of your foot to look for a broken bone (fracture).  TREATMENT  An elastic wrap may be recommended to support your foot. Resting, elevating, and applying cold compresses to your foot are often the best treatments for a foot contusion. Over-the-counter medicines may also be recommended for pain control. HOME CARE INSTRUCTIONS   Put ice on the injured area.  Put ice in a plastic bag.  Place a towel between your skin and the bag.  Leave the ice on for 15-20 minutes, 03-04 times a day.  Only take over-the-counter or prescription medicines for pain, discomfort, or fever as directed by your caregiver.  If told, use an elastic wrap as directed. This can help reduce swelling. You may remove the wrap for sleeping, showering, and bathing. If your toes become numb, cold, or blue, take the wrap off and reapply it more loosely.  Elevate your foot with pillows to reduce swelling.  Try to avoid standing or walking while the foot is painful. Do not resume use until instructed by your caregiver. Then, begin use gradually. If pain develops, decrease use. Gradually increase activities that do not cause discomfort until you have normal use of your foot.  See your caregiver as directed. It is very important to keep all follow-up appointments in order to avoid any lasting problems with your foot,  including long-term (chronic) pain. SEEK IMMEDIATE MEDICAL CARE IF:   You have increased redness, swelling, or pain in your foot.  Your swelling or pain is not relieved with medicines.  You have loss of feeling in your foot or are unable to move your toes.  Your foot turns cold or blue.  You have pain when you move your toes.  Your foot becomes warm to the touch.  Your contusion does not improve in 2 days. MAKE SURE YOU:   Understand these instructions.  Will watch your condition.  Will get help right away if you are not doing well or get worse. Document Released: 12/08/2005 Document Revised: 08/18/2011 Document Reviewed: 01/20/2011 [facility] Patient Information 2015 [place], [place]. This information is not intended to replace advice given to you by your health care provider. Make sure you discuss any questions you have with your health care provider.  Knee Sprain A knee sprain is a tear in one of the strong, fibrous tissues that connect the bones (ligaments) in your knee. The severity of the sprain depends on how much of the ligament is torn. The tear can be either partial or complete. CAUSES  Often, sprains are a result of a fall or injury. The force of the impact causes the fibers of your ligament to stretch too much. This excess tension causes the fibers of your ligament to tear. SIGNS AND SYMPTOMS  You may have some loss of motion in your knee. Other symptoms include:  Bruising.  Pain in the knee area.  Tenderness of the knee to the touch.  Swelling. DIAGNOSIS  To diagnose a knee sprain, your health care provider will physically examine your knee. Your health care provider may also suggest an X-ray exam of your knee to make sure no bones are broken. TREATMENT  If your ligament is only partially torn, treatment usually involves keeping the knee in a fixed position (immobilization) or bracing your knee for activities that require movement for several weeks. To do this,  your health care provider will apply a bandage, cast, or splint to keep your knee from moving and to support your knee during movement until it heals. For a partially torn ligament, the healing process usually takes 4-6 weeks. If your ligament is completely torn, depending on which ligament it is, you may need surgery to reconnect the ligament to the bone or reconstruct it. After surgery, a cast or splint may be applied and will need to stay on your knee for 4-6 weeks while your ligament heals. HOME CARE INSTRUCTIONS  Keep your injured knee elevated to decrease swelling.  To ease pain and swelling, apply ice to the injured area:  Put ice in a plastic bag.  Place a towel between your skin and the bag.  Leave the ice on for 20 minutes, 2-3 times a day.  Only take medicine for pain as directed by your health care provider.  Do not leave your knee unprotected until pain and stiffness go away (usually 4-6 weeks).  If you have a cast or splint, do not allow it to get wet. If you have been instructed not to remove it, cover it with a plastic bag when you shower or bathe. Do not swim.  Your health care provider may suggest exercises for you to do during your recovery to prevent or limit permanent weakness and stiffness. SEEK IMMEDIATE MEDICAL CARE IF:  Your cast or splint becomes damaged.  Your pain becomes worse.  You have significant pain, swelling, or numbness below the cast or splint. MAKE SURE YOU:  Understand these instructions.  Will watch your condition.  Will get help right away if you are not doing well or get worse. Document Released: 02/16/2005 Document Revised: 12/07/2012 Document Reviewed: 09/28/2012 [facility] Patient Information 2015 [place], [place]. This information is not intended to replace advice given to you by your health care provider. Make sure you discuss any questions you have with your health care provider.  Tendon Injury Tendons are strong, cordlike structures  that connect muscle to bone. Tendons are made up of woven fibers, like a rope. A tendon injury is a tear (rupture) of the tendon. The rupture may be partial (only a few of the fibers in your tendon rupture) or complete (your entire tendon ruptures). CAUSES  Tendon injuries can be caused by high-stress activities, such as sports. They also can be caused by a repetitive injury or by a single injury from an excessive, rapid force. SYMPTOMS  Symptoms of tendon injury include pain when you move the joint close to the tendon. Other symptoms are swelling, redness, and warmth. DIAGNOSIS  Tendon injuries often can be diagnosed by physical exam. However, sometimes an X-ray exam or advanced imaging, such as magnetic resonance imaging (MRI), is necessary to determine the extent of the injury. TREATMENT  Partial tendon ruptures often can be treated with immobilization. A splint, bandage, or removable brace usually is used to immobilize the injured tendon. Most injured tendons need to be immobilized for 1-2 months  before they are completely healed. Complete tendon ruptures may require surgical reattachment. Document Released: 03/26/2004 Document Revised: 02/05/2011 Document Reviewed: 05/10/2011 [facility] Patient Information 2015 [place], [place]. This information is not intended to replace advice given to you by your health care provider. Make sure you discuss any questions you have with your health care provider.

## 2014-08-29 ENCOUNTER — Other Ambulatory Visit: Payer: Self-pay

## 2014-08-29 ENCOUNTER — Encounter: Payer: Self-pay | Admitting: *Deleted

## 2014-08-29 ENCOUNTER — Emergency Department
Admission: EM | Admit: 2014-08-29 | Discharge: 2014-08-29 | Disposition: A | Discharge status: Home / Self Care | Payer: BLUE CROSS/BLUE SHIELD | Attending: Emergency Medicine | Admitting: Emergency Medicine

## 2014-08-29 DIAGNOSIS — Z880 Allergy status to penicillin: Secondary | ICD-10-CM | POA: Insufficient documentation

## 2014-08-29 DIAGNOSIS — X30XXXA Exposure to excessive natural heat, initial encounter: Secondary | ICD-10-CM | POA: Insufficient documentation

## 2014-08-29 DIAGNOSIS — Y9289 Other specified places as the place of occurrence of the external cause: Secondary | ICD-10-CM | POA: Diagnosis not present

## 2014-08-29 DIAGNOSIS — Y990 Civilian activity done for income or pay: Secondary | ICD-10-CM | POA: Diagnosis not present

## 2014-08-29 DIAGNOSIS — Y9389 Activity, other specified: Secondary | ICD-10-CM | POA: Diagnosis not present

## 2014-08-29 DIAGNOSIS — R55 Syncope and collapse: Secondary | ICD-10-CM

## 2014-08-29 DIAGNOSIS — T671XXA Heat syncope, initial encounter: Secondary | ICD-10-CM | POA: Diagnosis not present

## 2014-08-29 DIAGNOSIS — Z3202 Encounter for pregnancy test, result negative: Secondary | ICD-10-CM | POA: Insufficient documentation

## 2014-08-29 HISTORY — DX: Tuberculosis of spine: A18.01

## 2014-08-29 LAB — URINALYSIS COMPLETE WITH MICROSCOPIC (ARMC ONLY)
BACTERIA UA: NONE SEEN
Bilirubin Urine: NEGATIVE
Glucose, UA: NEGATIVE mg/dL
KETONES UR: NEGATIVE mg/dL
Leukocytes, UA: NEGATIVE
Nitrite: NEGATIVE
PH: 8 (ref 5.0–8.0)
Protein, ur: NEGATIVE mg/dL
Specific Gravity, Urine: 1.002 — ABNORMAL LOW (ref 1.005–1.030)

## 2014-08-29 LAB — BASIC METABOLIC PANEL
Anion gap: 10 (ref 5–15)
BUN: 10 mg/dL (ref 6–20)
CALCIUM: 9.5 mg/dL (ref 8.9–10.3)
CO2: 28 mmol/L (ref 22–32)
CREATININE: 0.84 mg/dL (ref 0.44–1.00)
Chloride: 102 mmol/L (ref 101–111)
GFR calc Af Amer: >60 mL/min (ref >60)
GLUCOSE: 108 mg/dL — AB (ref 65–99)
Potassium: 4.4 mmol/L (ref 3.5–5.1)
Sodium: 140 mmol/L (ref 135–145)

## 2014-08-29 LAB — CBC
HCT: 39.9 % (ref 35.0–47.0)
HEMOGLOBIN: 13.2 g/dL (ref 12.0–16.0)
MCH: 29.3 pg (ref 26.0–34.0)
MCHC: 33.2 g/dL (ref 32.0–36.0)
MCV: 88.1 fL (ref 80.0–100.0)
Platelets: 262 10*3/uL (ref 150–440)
RBC: 4.52 MIL/uL (ref 3.80–5.20)
RDW: 13.6 % (ref 11.5–14.5)
WBC: 6.3 10*3/uL (ref 3.6–11.0)

## 2014-08-29 LAB — POCT PREGNANCY, URINE: PREG TEST UR: NEGATIVE

## 2014-08-29 LAB — PREGNANCY, URINE: Preg Test, Ur: NEGATIVE

## 2014-08-29 NOTE — ED Provider Notes (Signed)
[facility] Emergency Department Provider Note  ____________________________________________  Time seen: 1610  I have reviewed the triage vital signs and the nursing notes.   HISTORY  Chief Complaint Loss of Consciousness  syncope    HPI [name] Nestleaula Tortorelli is a 24 y.o. 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 She began to feel overheated and lightheaded. She left the area she was working and [facility] She was feeling lightheaded and weak when she sat down in a chair for further evaluation. The nurse caring for her in the infirmary reports that the patient became unresponsive at that point. The patient subsequently regained consciousness. She is moved to a bed which she could lie down. She started to feel better. Blood pressure with pulse was obtained lying, sitting, and standing. Her blood pressure was essentially 160/90 and consistent despite position. Her heart rate did escalate from 90 up to 120 when she stood up.  The patient does have a history of postural orthostatic hypotension. She also reports having history of seizures.   At this time the patient reports she feels better. She has some mild fatigue and feels a little worn out.   Past Medical History  Diagnosis Date  . Asthma   . Seizures   . Chest pain   . Pott's disease     Patient Active Problem List   Diagnosis Date Noted  . POTS (postural orthostatic tachycardia syndrome) 11/23/2013  . Palpitations 11/23/2013  . Chest pain   . Nausea and vomiting 11/02/2011  . Vertigo 11/01/2011  . Labyrinthitis 11/01/2011  . Seizure disorder 11/01/2011  . Asthma 11/01/2011    Past Surgical History  Procedure Laterality Date  . Tonsillectomy      See nurses notes for outpatient medications.  Allergies Penicillins and Amoxicillin  Family History  Problem Relation Age of Onset  . Thyroid disease Brother     Social History History  Substance Use Topics   . Smoking status: Never Smoker   . Smokeless tobacco: Not on file  . Alcohol Use: Yes     Comment: rarely    Review of Systems  Constitutional: Negative for fever. Noted heat frustration earlier. See history of present illness ENT: Negative for sore throat. Cardiovascular: Negative for chest pain. Positive for syncope Respiratory: Negative for shortness of breath. Gastrointestinal: Negative for abdominal pain, vomiting and diarrhea. Genitourinary: Negative for dysuria. Musculoskeletal: No myalgias or injuries. Skin: Negative for rash. Neurological: Negative for headaches   10-point ROS otherwise negative.  ____________________________________________   PHYSICAL EXAM:  VITAL SIGNS: ED Triage Vitals  Enc Vitals Group     BP --      Pulse Rate 08/29/14 1327 83     Resp --      Temp 08/29/14 1327 99 F (37.2 C)     Temp Source 08/29/14 1327 Oral     SpO2 --      Weight --      Height --      Head Cir --      Peak Flow --      Pain Score --      Pain Loc --      Pain Edu? --      Excl. in GC? --     Constitutional: Alert and oriented. Well appearing and in no distress. ENT   Head: Normocephalic and atraumatic.   Nose: No congestion/rhinnorhea.   Mouth/Throat: Mucous membranes are moist. Cardiovascular: Normal  rate, regular rhythm, no murmur noted Respiratory:  Normal respiratory effort, no tachypnea.    Breath sounds are clear and equal bilaterally.  Gastrointestinal: Soft and nontender. No distention.  Back: No muscle spasm, no tenderness, no CVA tenderness. Musculoskeletal: No deformity noted. Nontender with normal range of motion in all extremities.  No noted edema. Neurologic:  Normal speech and language. No gross focal neurologic deficits are appreciated.  Alert and communicative. 5 or 5 strength in all 4 extremities. Ambulatory. Bilateral grip strength is equal. Negative pronator drift, negative Romberg, negative finger to nose. Cranial nerves  II-12 are intact. Skin:  Skin is warm, dry. No rash noted. Psychiatric: Mood and affect are normal. Speech and behavior are normal.  ____________________________________________    LABS (pertinent positives/negatives)  CBC, basic metabolic panel are both normal. Urine pregnancy is negative  ____________________________________________   EKG  ED ECG REPORT I, Fallon Haecker W, the attending physician, personally viewed and interpreted this ECG.   Date: 08/29/2014  EKG Time: 1339  Rate: 73  Rhythm:  sinus rhythm with short PR at 104  Axis: Normal  Intervals: PR of 104.  Remainder of intervals are normal.  ST&T Change: None noted   ____________________________________________    INITIAL IMPRESSION / ASSESSMENT AND PLAN / ED COURSE  Pertinent labs & imaging results that were available during my care of the patient were reviewed by me and considered in my medical decision making (see chart for details).  Well-appearing 34 female status post heat frustration and syncope earlier today. She does have  A history of postural prostatic tachycardia and seizure. She is feeling well and there is no indication for further treatment. I have counseled her to drink more fluids today and to be cautious in the heat.  ----------------------------------------- 5:18 PM on 08/29/2014 -----------------------------------------  As he made the last note, the patient looked well but we had not had a urinalysis back yet. This is still pending. The patient is now eager to leave. She looks well and is in no acute distress. Even if she had no ketones on her urine, I do not think it would change our clinical plan, which is to increase by mouth fluids today and tomorrow. We will discharge her to the emergency department with urinalysis pending. The patient understands this.  ____________________________________________   FINAL CLINICAL IMPRESSION(S) / ED DIAGNOSES  Final diagnoses:  Syncope and  collapse  Heat causing collapse      Darien Ramus, MD 08/29/14 1729

## 2014-08-29 NOTE — ED Notes (Signed)
Pt was working outside became hot and dizzy, sat down and had a syncopal episode

## 2014-08-29 NOTE — Discharge Instructions (Signed)
Heat-Related Illness °Heat-related illnesses occur when the body is unable to properly cool itself. The body normally cools itself by sweating. However, under some conditions sweating is not enough. In these cases, a person's body temperature rises rapidly. Very high body temperatures may damage the brain or other vital organs. Some examples of heat-related illnesses include: °· Heat stroke. This occurs when the body is unable to regulate its temperature. The body's temperature rises rapidly, the sweating mechanism fails, and the body is unable to cool down. Body temperature may rise to 106° F (41° C) or higher within 10 to 15 minutes. Heat stroke can cause death or permanent disability if emergency treatment is not provided. °· Heat exhaustion. This is a milder form of heat-related illness that can develop after several days of exposure to high temperatures and not enough fluids. It is the body's response to an excessive loss of the water and salt contained in sweat. °· Heat cramps. These usually affect people who sweat a lot during heavy activity. This sweating drains the body's salt and moisture. The low salt level in the muscles causes painful cramps. Heat cramps may also be a symptom of heat exhaustion. Heat cramps usually occur in the abdomen, arms, or legs. Get medical attention for cramps if you have heart problems or are on a low-sodium diet. °Those that are at greatest risk for heat-related illnesses include:  °· The elderly. °· Infant and the very young. °· People with mental illness and chronic diseases. °· People who are overweight (obese). °· Young and healthy people can even succumb to heat if they participate in strenuous physical activities during hot weather. °CAUSES  °Several factors affect the body's ability to cool itself during extremely hot weather. When the humidity is high, sweat will not evaporate as quickly. This prevents the body from releasing heat quickly. Other factors that can affect  the body's ability to cool down include:  °· Age. °· Obesity. °· Fever. °· Dehydration. °· Heart disease. °· Mental illness. °· Poor circulation. °· Sunburn. °· Prescription drug use. °· Alcohol use. °SYMPTOMS  °Heat stroke: Warning signs of heat stroke vary, but may include: °· An extremely high body temperature (above 103°F orally). °· A fast, strong pulse. °· Dizziness. °· Confusion. °· Red, hot, and dry skin. °· No sweating. °· Throbbing headache. °· Feeling sick to your stomach (nauseous). °· Unconsciousness. °Heat exhaustion: Warning signs of heat exhaustion include: °· Heavy sweating. °· Tiredness. °· Headache. °· Paleness. °· Weakness. °· Feeling sick to your stomach (nauseous) or vomiting. °· Muscle cramps. °Heat cramps °· Muscle pains or spasms. °TREATMENT  °Heat stroke °· Get into a cool environment. An indoor place that is air-conditioned may be best. °· Take a cool shower or bath. Have someone around to make sure you are okay. °· Take your temperature. Make sure it is going down. °Heat exhaustion °· Drink plenty of fluids. Do not drink liquids that contain caffeine, alcohol, or large amounts of sugar. These cause you to lose more body fluid. Also, avoid very cold drinks. They can cause stomach cramps. °· Get into a cool environment. An indoor place that is air-conditioned may be best. °· Take a cool shower or bath. Have someone around to make sure you are okay. °· Put on lightweight clothing. °Heat cramps °· Stop whatever activity you were doing. Do not attempt to do that activity for at least 3 hours after the cramps have gone away. °· Get into a cool environment. An indoor   place that is air-conditioned may be best. °HOME CARE INSTRUCTIONS  °To protect your health when temperatures are extremely high, follow these tips: °· During heavy exercise in a hot environment, drink two to four glasses (16-32 ounces) of cool fluids each hour. Do not wait until you are thirsty to drink. Warning: If your caregiver  limits the amount of fluid you drink or has you on water pills, ask how much you should drink while the weather is hot. °· Do not drink liquids that contain caffeine, alcohol, or large amounts of sugar. These cause you to lose more body fluid. °· Avoid very cold drinks. They can cause stomach cramps. °· Wear appropriate clothing. Choose lightweight, light-colored, loose-fitting clothing. °· If you must be outdoors, try to limit your outdoor activity to morning and evening hours. Try to rest often in shady areas. °· If you are not used to working or exercising in a hot environment, start slowly and pick up the pace gradually. °· Stay cool in an air-conditioned place if possible. If your home does not have air conditioning, go to the shopping mall or public library. °· Taking a cool shower or bath may help you cool off. °SEEK MEDICAL CARE IF:  °· You see any of the symptoms listed above. You may be dealing with a life-threatening emergency. °· Symptoms worsen or last longer than 1 hour. °· Heat cramps do not get better in 1 hour. °MAKE SURE YOU:  °· Understand these instructions. °· Will watch your condition. °· Will get help right away if you are not doing well or get worse. °Document Released: 11/26/2007 Document Revised: 05/11/2011 Document Reviewed: 11/26/2007 °[employer]® Patient Information ©2015 [employer]. This information is not intended to replace advice given to you by your health care provider. Make sure you discuss any questions you have with your health care provider. ° °

## 2014-09-06 ENCOUNTER — Emergency Department
Admission: EM | Admit: 2014-09-06 | Discharge: 2014-09-07 | Disposition: A | Discharge status: Home / Self Care | Payer: BLUE CROSS/BLUE SHIELD | Attending: Emergency Medicine | Admitting: Emergency Medicine

## 2014-09-06 ENCOUNTER — Encounter: Payer: Self-pay | Admitting: Emergency Medicine

## 2014-09-06 DIAGNOSIS — Z880 Allergy status to penicillin: Secondary | ICD-10-CM | POA: Insufficient documentation

## 2014-09-06 DIAGNOSIS — Y9289 Other specified places as the place of occurrence of the external cause: Secondary | ICD-10-CM | POA: Insufficient documentation

## 2014-09-06 DIAGNOSIS — Y998 Other external cause status: Secondary | ICD-10-CM | POA: Insufficient documentation

## 2014-09-06 DIAGNOSIS — Z79899 Other long term (current) drug therapy: Secondary | ICD-10-CM | POA: Diagnosis not present

## 2014-09-06 DIAGNOSIS — X58XXXA Exposure to other specified factors, initial encounter: Secondary | ICD-10-CM | POA: Insufficient documentation

## 2014-09-06 DIAGNOSIS — S139XXA Sprain of joints and ligaments of unspecified parts of neck, initial encounter: Secondary | ICD-10-CM

## 2014-09-06 DIAGNOSIS — M542 Cervicalgia: Secondary | ICD-10-CM | POA: Diagnosis present

## 2014-09-06 DIAGNOSIS — Z7951 Long term (current) use of inhaled steroids: Secondary | ICD-10-CM | POA: Diagnosis not present

## 2014-09-06 DIAGNOSIS — Y9389 Activity, other specified: Secondary | ICD-10-CM | POA: Diagnosis not present

## 2014-09-06 MED ORDER — LIDOCAINE HCL (PF) 1 % IJ SOLN
2.0000 mL | Freq: Once | INTRAMUSCULAR | Status: AC
  Administered 2014-09-07: 2 mL via INTRADERMAL

## 2014-09-06 MED ORDER — BUPIVACAINE HCL (PF) 0.5 % IJ SOLN
10.0000 mL | Freq: Once | INTRAMUSCULAR | Status: AC
  Administered 2014-09-07: 10 mL

## 2014-09-06 NOTE — ED Notes (Signed)
Pt reports pain to left side of neck since yesterday.  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  Pt reports she awoke from sleep last night with pain, feeling feverish, and nausea.  Pt reports some nausea today.  Pt reports diarrhea x 2 days but unsure if it's related.  PT states pain has gotten worse over time.  Pt NAD at this time.

## 2014-09-06 NOTE — ED Provider Notes (Addendum)
[facility] Emergency Department Provider Note    ____________________________________________  Time seen: 2350  I have reviewed the triage vital signs and the nursing notes.   HISTORY  Chief Complaint Torticollis   History limited by: Not Limited   HPI [name] [name] is a 24 y.o. female who presents to the emergency department because of left-sided neck pain. She states that the pain started yesterday. It gradually got worse since then. It is located on the left side of her neck. She does not recall any traumatic injury to her neck. She does not recall straining her neck or lifting anything particularly heavy boxes. 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 The patient does not have any difficulty with swallowing or speech. Denies any pain or numbness down her arms or legs.      Past Medical History  Diagnosis Date  . Asthma   . Seizures   . Chest pain   . Pott's disease     Patient Active Problem List   Diagnosis Date Noted  . POTS (postural orthostatic tachycardia syndrome) 11/23/2013  . Palpitations 11/23/2013  . Chest pain   . Nausea and vomiting 11/02/2011  . Vertigo 11/01/2011  . Labyrinthitis 11/01/2011  . Seizure disorder 11/01/2011  . Asthma 11/01/2011    Past Surgical History  Procedure Laterality Date  . Tonsillectomy      Current Outpatient Rx  Name  Route  Sig  Dispense  Refill  . albuterol (PROVENTIL HFA;VENTOLIN HFA) 108 (90 BASE) MCG/ACT inhaler   Inhalation   Inhale into the lungs every 6 (six) hours as needed for wheezing or shortness of breath.         . DULoxetine (CYMBALTA) 60 MG capsule   Oral   Take 60 mg by mouth daily.         . fludrocortisone (FLORINEF) 0.1 MG tablet   Oral   Take 0.2 mg by mouth daily.         . montelukast (SINGULAIR) 10 MG tablet   Oral   Take 10 mg by mouth at bedtime.         . potassium chloride SA (KLOR-CON M20)  20 MEQ tablet   Oral   Take 1 tablet (20 mEq total) by mouth daily.   30 tablet   3   . zonisamide (ZONEGRAN) 100 MG capsule   Oral   Take 300 mg by mouth at bedtime.           Allergies Penicillins and Amoxicillin  Family History  Problem Relation Age of Onset  . Thyroid disease Brother     Social History History  Substance Use Topics  . Smoking status: Never Smoker   . Smokeless tobacco: Not on file  . Alcohol Use: Yes     Comment: rarely    Review of Systems  Constitutional: Negative for fever. Cardiovascular: Negative for chest pain. Respiratory: Negative for shortness of breath. Gastrointestinal: Negative for abdominal pain, some nausea Genitourinary: Negative for dysuria. Musculoskeletal: Negative for back pain. Skin: Negative for rash. Neurological: Negative for headaches, focal weakness or numbness.   10-point ROS otherwise negative.  ____________________________________________   PHYSICAL EXAM:  VITAL SIGNS: ED Triage Vitals  Enc Vitals Group     BP 09/06/14 2312 136/106 mmHg     Pulse Rate 09/06/14 2312 80     Resp 09/06/14 2312 18     Temp 09/06/14 2312 98.2 F (36.8 C)  Temp Source 09/06/14 2312 Oral     SpO2 09/06/14 2312 100 %     Weight 09/06/14 2312 115 lb (52.164 kg)     Height 09/06/14 2312 5\' 4"  (1.626 m)     Head Cir --      Peak Flow --      Pain Score 09/06/14 2313 4   Constitutional: Alert and oriented. Well appearing and in no distress. Eyes: Conjunctivae are normal. PERRL. Normal extraocular movements. ENT   Head: Normocephalic and atraumatic.   Nose: No congestion/rhinnorhea.   Mouth/Throat: Mucous membranes are moist.   Neck: No stridor. Some mild tenderness to the left paracervical spinal muscles. No midline tenderness. No overlying erythema or rash. Hematological/Lymphatic/Immunilogical: No cervical lymphadenopathy. Cardiovascular: Normal rate, regular rhythm.   Respiratory: Normal respiratory  effort without tachypnea nor retractions.  Genitourinary: Deferred Musculoskeletal: Normal range of motion in all extremities.  Neurologic:  Normal speech and language. No gross focal neurologic deficits are appreciated. Speech is normal.  Skin:  Skin is warm, dry and intact. No rash noted. Psychiatric: Mood and affect are normal. Speech and behavior are normal. Patient exhibits appropriate insight and judgment.  ____________________________________________    LABS (pertinent positives/negatives)  None  ____________________________________________   EKG  None  ____________________________________________    RADIOLOGY  None  ____________________________________________   PROCEDURES  Procedure(s) performed: Paraspinous Cervical Block, see procedure note(s).  Critical Care performed: No  Paraspinous Cervical Block Performed by: Phineas SemenGOODMAN, Amarri Satterly  Indication: Left sided neck pain  Preparation: Patient was prepped and draped in the usual sterile fashion. Needle gauge: 25 G Location technique: anatomical landmarks. Left paraspinous cervical muscle, lateral to C7  Local anesthetic: 1:4 Lidocaine 1% to Bupivacaine 0.5%  Anesthetic total: 1.5 ml  Outcome: pain improved Patient tolerance: Patient tolerated the procedure well with no immediate complications.  ____________________________________________   INITIAL IMPRESSION / ASSESSMENT AND PLAN / ED COURSE  Pertinent labs & imaging results that were available during my care of the patient were reviewed by me and considered in my medical decision making (see chart for details).  Patient presents with left-sided neck pain. Patient was willing to undergo a paracervical spinal anesthetic injection. Patient tolerated procedure well. Patient states that she did have good relief of pain after procedure. Think highly likely patient has a cervical sprain. Will discharge home with  Flexeril.  ____________________________________________   FINAL CLINICAL IMPRESSION(S) / ED DIAGNOSES  Final diagnoses:  Cervical sprain, initial encounter     Phineas SemenGraydon Floria Brandau, MD 09/07/14 [id number]  Phineas SemenGraydon Liara Holm, MD 09/19/14 [phone]

## 2014-09-06 NOTE — ED Notes (Signed)
Pt to ED c/o L neck pain. Denies specific injury.

## 2014-09-07 MED ORDER — CYCLOBENZAPRINE HCL 5 MG PO TABS: 5.0000 mg | ORAL_TABLET | Freq: Three times a day (TID) | ORAL | Status: DC | PRN

## 2014-09-07 MED ORDER — BUPIVACAINE HCL (PF) 0.5 % IJ SOLN
INTRAMUSCULAR | Status: AC
  Administered 2014-09-07: 10 mL
  Filled 2014-09-07: qty 30

## 2014-09-07 MED ORDER — LIDOCAINE HCL (PF) 1 % IJ SOLN
INTRAMUSCULAR | Status: AC
  Administered 2014-09-07: 2 mL via INTRADERMAL
  Filled 2014-09-07: qty 5

## 2014-09-07 NOTE — Discharge Instructions (Signed)
Please seek medical attention for any high fevers, chest pain, shortness of breath, change in behavior, persistent vomiting, bloody stool or any other new or concerning symptoms.  Cervical Sprain A cervical sprain is when the tissues (ligaments) that hold the neck bones in place stretch or tear. HOME CARE   Put ice on the injured area.  Put ice in a plastic bag.  Place a towel between your skin and the bag.  Leave the ice on for 15-20 minutes, 3-4 times a day.  You may have been given a collar to wear. This collar keeps your neck from moving while you heal.  Do not take the collar off unless told by your doctor.  If you have long hair, keep it outside of the collar.  Ask your doctor before changing the position of your collar. You may need to change its position over time to make it more comfortable.  If you are allowed to take off the collar for cleaning or bathing, follow your doctor's instructions on how to do it safely.  Keep your collar clean by wiping it with mild soap and water. Dry it completely. If the collar has removable pads, remove them every 1-2 days to hand wash them with soap and water. Allow them to air dry. They should be dry before you wear them in the collar.  Do not drive while wearing the collar.  Only take medicine as told by your doctor.  Keep all doctor visits as told.  Keep all physical therapy visits as told.  Adjust your work station so that you have good posture while you work.  Avoid positions and activities that make your problems worse.  Warm up and stretch before being active. GET HELP IF:  Your pain is not controlled with medicine.  You cannot take less pain medicine over time as planned.  Your activity level does not improve as expected. GET HELP RIGHT AWAY IF:   You are bleeding.  Your stomach is upset.  You have an allergic reaction to your medicine.  You develop new problems that you cannot explain.  You lose feeling  (become numb) or you cannot move any part of your body (paralysis).  You have tingling or weakness in any part of your body.  Your symptoms get worse. Symptoms include:  Pain, soreness, stiffness, puffiness (swelling), or a burning feeling in your neck.  Pain when your neck is touched.  Shoulder or upper back pain.  Limited ability to move your neck.  Headache.  Dizziness.  Your hands or arms feel week, lose feeling, or tingle.  Muscle spasms.  Difficulty swallowing or chewing. MAKE SURE YOU:   Understand these instructions.  Will watch your condition.  Will get help right away if you are not doing well or get worse. Document Released: 08/05/2007 Document Revised: 10/19/2012 Document Reviewed: 08/24/2012 [facility] Patient Information 2015 [employer] [place]. This information is not intended to replace advice given to you by your health care provider. Make sure you discuss any questions you have with your health care provider.

## 2014-09-28 ENCOUNTER — Telehealth: Payer: Self-pay | Admitting: Cardiology

## 2014-09-28 NOTE — Telephone Encounter (Signed)
New Message   Pt mother calling to get daughter an appt next week, i asked her is the pt is having symptoms for the emrgency to start (smart phrase) no pt is at work  I Placed pt on wait list, and gave her appointment with Herma Carson,   Pt mother is very upset and she states that if you cant see her sooner than aug 22 don't call at all.

## 2014-09-28 NOTE — Telephone Encounter (Signed)
Left message to call back  

## 2014-10-04 NOTE — Telephone Encounter (Signed)
Pt was scheduled for an appt 8/6 at 2:45 pm

## 2014-10-05 ENCOUNTER — Ambulatory Visit (INDEPENDENT_AMBULATORY_CARE_PROVIDER_SITE_OTHER): Payer: BLUE CROSS/BLUE SHIELD

## 2014-10-05 ENCOUNTER — Ambulatory Visit (INDEPENDENT_AMBULATORY_CARE_PROVIDER_SITE_OTHER): Payer: BLUE CROSS/BLUE SHIELD | Admitting: Cardiology

## 2014-10-05 ENCOUNTER — Other Ambulatory Visit: Payer: Self-pay | Admitting: Cardiology

## 2014-10-05 ENCOUNTER — Encounter: Payer: Self-pay | Admitting: Cardiology

## 2014-10-05 VITALS — BP 100/70 | HR 76 | Ht 64.0 in | Wt 119.8 lb

## 2014-10-05 DIAGNOSIS — R55 Syncope and collapse: Secondary | ICD-10-CM | POA: Diagnosis not present

## 2014-10-05 DIAGNOSIS — R000 Tachycardia, unspecified: Secondary | ICD-10-CM

## 2014-10-05 DIAGNOSIS — I498 Other specified cardiac arrhythmias: Secondary | ICD-10-CM

## 2014-10-05 DIAGNOSIS — I951 Orthostatic hypotension: Secondary | ICD-10-CM | POA: Diagnosis not present

## 2014-10-05 DIAGNOSIS — G90A Postural orthostatic tachycardia syndrome (POTS): Secondary | ICD-10-CM

## 2014-10-05 LAB — BASIC METABOLIC PANEL
BUN: 11 mg/dL (ref 6–23)
CHLORIDE: 102 meq/L (ref 96–112)
CO2: 29 mEq/L (ref 19–32)
Calcium: 9.6 mg/dL (ref 8.4–10.5)
Creatinine, Ser: 0.75 mg/dL (ref 0.40–1.20)
GFR: 98.94 mL/min (ref >60.00)
Glucose, Bld: 95 mg/dL (ref 70–99)
Potassium: 3.8 mEq/L (ref 3.5–5.1)
SODIUM: 138 meq/L (ref 135–145)

## 2014-10-05 NOTE — Progress Notes (Signed)
[address] [place], [place]  [address] Phone: [phone] Fax:  [phone]  Date:  10/05/2014   ID:  [name] [name], DOB Dec 29, 1990, MRN [id number]  PCP:  Gweneth Dimitri, MD   History of Present Illness: [name] [name] is a 24 y.o. female with seizure disorder here for followup of orthostatic hypotension. she has started on Florinef 0.2 milligrams. This was increased from 0.1 mg. This was done on 05/25/12 we discussed the possibility of pots. My goal is to taper off medications in the future. Prior lab work reviewed. Sodium 137, potassium 3.6. Echocardiogram with mild mitral valve prolapse, normal EF. 24 Holter monitor with occasional PVC/couplets. No adverse arrhythmias.  Previously, she has had symptoms recently worsening of palpitations, heart racing, feeling faint with tidal vision like she is going to pass out. They can occur several times a day with or without exertion. She is trying to drink Gatorade, water, orange juice making sure that she is eating throughout the day but her symptoms continued to persist. Lab work including TSH, CBC, basic metabolic profile are normal. She is underweight with her BMI being 18. I agree with possibility of postural orthostatic tachycardia syndrome. Feels improved on higher dose Fludrocortisone 0.2mg . Also Gatorade has helped. She notices significant change if she does not take her Gatorade.She is also gained approximately 4 pounds.  09/15/12-she is doing very well on current drug regimen. No further syncope. She wishes to continue currently. 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 One occurred while she was in the elevator at school, prodrome of tachycardia, weakness. Went to the emergency room where she was found to have a potassium of approximately 3. She was given potassium supplementation and sent home. She then had  another episode last weekend with profound weakness, went to the emergency room again and was found to have a potassium of 2.5. Dr. Uvaldo Rising at check a potassium level on 11/17 and it was normal. I have check potassium levels routinely after starting and increasing Florinef and they were normal as well. She had a viral illness, weight loss. Would like to undergo tonsillectomy because of frequent infections. Dr. Ezzard Standing. I think that this is reasonable as long as her potassium is fine. We will check.    11/23/13-had an episode that landed her in the emergency room last night. Electronically normal. She has had periods of hypokalemia in the past. See below for details.  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 Sat down and passed out. Syncope, shook her. Had 2 separate episodes. I will check a 30 day event monitor. She has been taking Florinef 0.2. Blood work has been monitored at school.   Wt Readings from Last 3 Encounters:  10/05/14 119 lb 12.8 oz (54.341 kg)  09/06/14 115 lb (52.164 kg)  11/23/13 120 lb 3.2 oz (54.522 kg)     Past Medical History  Diagnosis Date  . Asthma   . Seizures   . Chest pain   . Pott's disease     Past Surgical History  Procedure Laterality Date  . Tonsillectomy      Current Outpatient Prescriptions  Medication Sig Dispense Refill  . albuterol (PROVENTIL HFA;VENTOLIN HFA) 108 (90 BASE) MCG/ACT inhaler Inhale into the lungs every 6 (six) hours as needed for wheezing or shortness of breath.    . DULoxetine (  CYMBALTA) 60 MG capsule Take 60 mg by mouth daily.    . fludrocortisone (FLORINEF) 0.1 MG tablet Take 0.2 mg by mouth daily.    . montelukast (SINGULAIR) 10 MG tablet Take 10 mg by mouth at bedtime.    . potassium chloride SA (KLOR-CON M20) 20 MEQ tablet Take 1 tablet (20 mEq total) by mouth daily. 30 tablet 3  . zonisamide (ZONEGRAN) 100 MG capsule Take 300 mg by mouth at bedtime.     No current facility-administered medications  for this visit.    Allergies:    Allergies  Allergen Reactions  . Penicillins   . Amoxicillin Rash    Social History:  The patient  reports that she has never smoked. She does not have any smokeless tobacco history on file. She reports that she drinks alcohol. She reports that she does not use illicit drugs.   ROS:  Please see the history of present illness.   Denies any bleeding, strokelike symptoms, orthopnea, PND  PHYSICAL EXAM: VS:  BP 100/70 mmHg  Pulse 76  Ht  (1.626 m)  Wt 119 lb 12.8 oz (54.341 kg)  BMI 20.55 kg/m2 Thin,  in no acute distress HEENT: normal Neck: no JVD Cardiac:  normal S1, S2; RRR; no murmur Lungs:  clear to auscultation bilaterally, no wheezing, rhonchi or rales Abd: soft, nontender, no hepatomegaly Ext: no edema Skin: warm and dry Neuro: no focal abnormalities noted  EKG:      Prior Holter monitor with PVCs, benign Echocardiogram with normal ejection fraction  ASSESSMENT AND PLAN:  1. Orthostatic hypotension/syncope/POTS-Florinef therapy.  Vagal component, nausea.  All electrolytes normal. Previously, after showing stability on current dose of Florinef, she ended up having hypokalemia on 2 separate occasions. A viral illness likely precipitated this as well as Florinef which can cause hypokalemia. On standing potassium supplementation 20 mEq daily. She is in school at [employer]. 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 She has been doing quite well with her current drug dosage and regimen.  We will need to have very close monitoring of her potassium levels. " POTS place." website. Encouraged weight gain, salt liberalization, liberalization, compression. 2. Palpitations-SVT possibility. Heart rate was quite elevated once, 160. I will check a 30 day event monitor. 3. Return to clinic in approximately 5-6 weeks.     Signed, Donato Schultz, MD [facility]  10/05/2014 2:52 PM

## 2014-10-05 NOTE — Patient Instructions (Signed)
Medication Instructions:  Your physician recommends that you continue on your current medications as directed. Please refer to the Current Medication list given to you today.  Labwork: Please have blood work today (BMP)  Testing/Procedures: Your physician has recommended that you wear an event monitor. Event monitors are medical devices that record the heart's electrical activity. Doctors most often [place] these monitors to diagnose arrhythmias. Arrhythmias are problems with the speed or rhythm of the heartbeat. The monitor is a small, portable device. You can wear one while you do your normal daily activities. This is usually used to diagnose what is causing palpitations/syncope (passing out).  Follow-Up: Follow up after monitor.  Thank you for choosing [facility]!!

## 2014-10-22 ENCOUNTER — Ambulatory Visit: Payer: BLUE CROSS/BLUE SHIELD | Admitting: Physician Assistant

## 2014-11-21 ENCOUNTER — Encounter: Payer: Self-pay | Admitting: Cardiology

## 2014-11-21 ENCOUNTER — Ambulatory Visit (INDEPENDENT_AMBULATORY_CARE_PROVIDER_SITE_OTHER): Payer: Self-pay | Admitting: Cardiology

## 2014-11-21 VITALS — BP 118/82 | HR 74 | Ht 64.0 in | Wt 120.6 lb

## 2014-11-21 DIAGNOSIS — G90A Postural orthostatic tachycardia syndrome (POTS): Secondary | ICD-10-CM

## 2014-11-21 DIAGNOSIS — I498 Other specified cardiac arrhythmias: Secondary | ICD-10-CM

## 2014-11-21 DIAGNOSIS — I951 Orthostatic hypotension: Secondary | ICD-10-CM

## 2014-11-21 DIAGNOSIS — R000 Tachycardia, unspecified: Secondary | ICD-10-CM

## 2014-11-21 NOTE — Progress Notes (Signed)
[address] [place], [place]  [address] Phone: [phone] Fax:  [phone]  Date:  11/21/2014   ID:  [name] [name], DOB 08-22-1990, MRN [id number]  PCP:  Gweneth Dimitri, MD   History of Present Illness: [name] [name] is a 24 y.o. female with seizure disorder here for followup of orthostatic hypotension. she has started on Florinef 0.2 milligrams. This was increased from 0.1 mg. This was done on 05/25/12 we discussed the possibility of pots. My goal is to taper off medications in the future. Prior lab work reviewed. Sodium 137, potassium 3.6. Echocardiogram with mild mitral valve prolapse, normal EF. 24 Holter monitor with occasional PVC/couplets. No adverse arrhythmias.  Previously, she has had symptoms recently worsening of palpitations, heart racing, feeling faint with tidal vision like she is going to pass out. They can occur several times a day with or without exertion. She is trying to drink Gatorade, water, orange juice making sure that she is eating throughout the day but her symptoms continued to persist. Lab work including TSH, CBC, basic metabolic profile are normal. She is underweight with her BMI being 18. I agree with possibility of postural orthostatic tachycardia syndrome. Feels improved on higher dose Fludrocortisone 0.2mg . Also Gatorade has helped. She notices significant change if she does not take her Gatorade.She is also gained approximately 4 pounds.  09/15/12-she is doing very well on current drug regimen. No further syncope. She wishes to continue currently. 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 One occurred while she was in the elevator at school, prodrome of tachycardia, weakness. Went to the emergency room where she was found to have a potassium of approximately 3. She was given potassium supplementation and sent home. She then had  another episode last weekend with profound weakness, went to the emergency room again and was found to have a potassium of 2.5. Dr. Uvaldo Rising at check a potassium level on 11/17 and it was normal. I have check potassium levels routinely after starting and increasing Florinef and they were normal as well. She had a viral illness, weight loss. Would like to undergo tonsillectomy because of frequent infections. Dr. Ezzard Standing. I think that this is reasonable as long as her potassium is fine. We will check.    11/23/13-had an episode that landed her in the emergency room last night. Electronically normal. She has had periods of hypokalemia in the past. See below for details.  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 Sat down and passed out. Syncope, shook her. Had 2 separate episodes. I will check a 30 day event monitor. She has been taking Florinef 0.2. Blood work has been monitored at school.  11/21/14-event monitor overall reassuring showing sinus tachycardia at times. There was no evidence of reentrant tachycardia. She has not had any further evidence of syncope.   Wt Readings from Last 3 Encounters:  11/21/14 120 lb 9.6 oz (54.704 kg)  10/05/14 119 lb 12.8 oz (54.341 kg)  09/06/14 115 lb (52.164 kg)     Past Medical History  Diagnosis Date  . Asthma   . Seizures   . Chest pain   . Pott's disease     Past Surgical History  Procedure Laterality Date  . Tonsillectomy      Current Outpatient Prescriptions  Medication Sig Dispense Refill  . albuterol (PROVENTIL HFA;VENTOLIN HFA)  108 (90 BASE) MCG/ACT inhaler Inhale into the lungs every 6 (six) hours as needed for wheezing or shortness of breath.    . DULoxetine (CYMBALTA) 60 MG capsule Take 60 mg by mouth daily.    . fludrocortisone (FLORINEF) 0.1 MG tablet Take 0.2 mg by mouth daily.    . montelukast (SINGULAIR) 10 MG tablet Take 10 mg by mouth at bedtime.    . potassium chloride SA (KLOR-CON M20) 20 MEQ tablet Take 1  tablet (20 mEq total) by mouth daily. 30 tablet 3  . zonisamide (ZONEGRAN) 100 MG capsule Take 300 mg by mouth at bedtime.     No current facility-administered medications for this visit.    Allergies:    Allergies  Allergen Reactions  . Penicillins     Rash   . Amoxicillin Rash    Social History:  The patient  reports that she has never smoked. She does not have any smokeless tobacco history on file. She reports that she drinks alcohol. She reports that she does not use illicit drugs.   ROS:  Please see the history of present illness.   Denies any bleeding, strokelike symptoms, orthopnea, PND  PHYSICAL EXAM: VS:  BP 118/82 mmHg  Pulse 74  Ht  (1.626 m)  Wt 120 lb 9.6 oz (54.704 kg)  BMI 20.69 kg/m2  SpO2 98%  LMP 11/16/2014 Thin,  in no acute distress HEENT: normal Neck: no JVD Cardiac:  normal S1, S2; RRR; no murmur Lungs:  clear to auscultation bilaterally, no wheezing, rhonchi or rales Abd: soft, nontender, no hepatomegaly Ext: no edema Skin: warm and dry Neuro: no focal abnormalities noted  EKG:      Prior Holter monitor with PVCs, benign Echocardiogram with normal ejection fraction Event monitor 10/05/14    No adverse arrhythmias identified   Sinus tachycardia at times noted as high as 180 bpm.   No pauses.   Overall reassuring monitor.  ASSESSMENT AND PLAN:  1. Orthostatic hypotension/syncope/POTS-Florinef therapy 0.2 mg.  Her event monitor on 10/05/14 was reassuring. She did have periods of sinus tachycardia as high as 180 bpm at times. No ventricular arrhythmias. Reassurance. Vagal component, nausea.  All electrolytes normal. Previously, after showing stability on current dose of Florinef, she ended up having hypokalemia on 2 separate occasions.  On standing potassium supplementation 20 mEq daily. She has graduated from [employer]. 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 She has been doing quite well with her current drug dosage and regimen.  " POTS place." website. Encouraged weight gain, salt liberalization, liberalization, compression. 2. Palpitations- Heart rate was quite elevated once, 160. 3. Return to clinic in approximately 6 months.     Signed, Donato Schultz, MD [facility]  11/21/2014 2:39 PM

## 2014-11-21 NOTE — Patient Instructions (Signed)
Medication Instructions:  Your physician recommends that you continue on your current medications as directed. Please refer to the Current Medication list given to you today.  Labwork: NONE  Testing/Procedures: NONE  Follow-Up: Your physician wants you to follow-up in: 6 month with Dr. Anne Fu. You will receive a reminder letter in the mail two months in advance. If you don't receive a letter, please call our office to schedule the follow-up appointment.   Any Other Special Instructions Will Be Listed Below (If Applicable).

## 2015-01-17 ENCOUNTER — Ambulatory Visit (INDEPENDENT_AMBULATORY_CARE_PROVIDER_SITE_OTHER): Payer: BLUE CROSS/BLUE SHIELD | Admitting: Endocrinology

## 2015-01-17 ENCOUNTER — Encounter: Payer: Self-pay | Admitting: Endocrinology

## 2015-01-17 VITALS — BP 106/70 | HR 77 | Temp 98.3°F | Ht 64.0 in | Wt 120.0 lb

## 2015-01-17 DIAGNOSIS — E041 Nontoxic single thyroid nodule: Secondary | ICD-10-CM

## 2015-01-17 NOTE — Patient Instructions (Signed)
We don't know what the cause of the thyroid lump was, but no treatment is needed now.  Please come back for a follow-up appointment in 2 months.

## 2015-01-17 NOTE — Progress Notes (Signed)
Subjective:    Patient ID: [name] [name], female    DOB: Aug 21, 1990, 24 y.o.   MRN: [id number]  HPI Pt says 1 month ago, she developed moderate swelling at the right ant neck.   A few days ago, she developed assoc pain, but the nodule decreased in size.  she is unaware of ever having had thyroid problems prior to this.  He has no h/o XRT or surgery to the neck.  She shows me a photo of the nodule on her cell phone, from when it was readily visible from a foot or so away. Past Medical History  Diagnosis Date  . Asthma   . Seizures (HCC)   . Chest pain   . Pott's disease     Past Surgical History  Procedure Laterality Date  . Tonsillectomy      Social History   Social History  . Marital Status: Single    Spouse Name: N/A  . Number of Children: N/A  . Years of Education: N/A   Occupational History  . Not on file.   Social History Main Topics  . Smoking status: Never Smoker   . Smokeless tobacco: Not on file  . Alcohol Use: Yes     Comment: rarely  . Drug Use: No  . Sexual Activity: Not on file   Other Topics Concern  . Not on file   Social History Narrative    Current Outpatient Prescriptions on File Prior to Visit  Medication Sig Dispense Refill  . albuterol (PROVENTIL HFA;VENTOLIN HFA) 108 (90 BASE) MCG/ACT inhaler Inhale into the lungs every 6 (six) hours as needed for wheezing or shortness of breath.    . DULoxetine (CYMBALTA) 60 MG capsule Take 60 mg by mouth daily.    . fludrocortisone (FLORINEF) 0.1 MG tablet Take 0.2 mg by mouth daily.    . montelukast (SINGULAIR) 10 MG tablet Take 10 mg by mouth at bedtime.     No current facility-administered medications on file prior to visit.    Allergies  Allergen Reactions  . Penicillins     Rash   . Amoxicillin Rash    Family History  Problem Relation Age of Onset  . Thyroid disease Brother     hashimoto's thyroiditis  . Thyroid disease Sister     BP 106/70 mmHg  Pulse 77  Temp(Src) 98.3 F (36.8 C)  (Oral)  Ht  (1.626 m)  Wt 120 lb (54.432 kg)  BMI 20.59 kg/m2  SpO2 97%  LMP 01/06/2015  Review of Systems Denies weight change, hoarseness, visual loss, chest pain, sob, cough, dysphagia, skin rash, headache, and numbness.  She reports easy bruising, rhinorrhea, and cold intolerance.  Depression is well-controlled    Objective:   Physical Exam VS: see vs page GEN: no distress HEAD: head: no deformity eyes: no periorbital swelling, no proptosis external nose and ears are normal mouth: no lesion seen NECK: supple, thyroid slightly enlarged.  Surface is irregular, but no nodule is palpable CHEST WALL: no deformity LUNGS:  Clear to auscultation CV: reg rate and rhythm, no murmur ABD: abdomen is soft, nontender.  no hepatosplenomegaly.  not distended.  no hernia MUSCULOSKELETAL: muscle bulk and strength are grossly normal.  no obvious joint swelling.  gait is normal and steady EXTEMITIES: no deformity.  no ulcer on the feet.  feet are of normal color and temp.  no edema PULSES: dorsalis pedis intact bilat.  no carotid bruit NEURO:  cn 2-12 grossly intact.   readily moves  all 4's.  sensation is intact to touch on the feet SKIN:  Normal texture and temperature.  No rash or suspicious lesion is visible.   NODES:  None palpable at the neck PSYCH: alert, well-oriented.  Does not appear anxious nor depressed.   US (01/04/15): 22x17x27 right lobe mass, complex  I have reviewed outside records, and summarized: Pt was noted to have thyroid mass, and referred here.  outside test results are reviewed:  TSH=0.82     Assessment & Plan:  Thyroid mass, new to me, resolved.  We discussed rechecking the US now, but pt raises concerns about the cost.  We'll hold off for now, but she will need a recheck US at some point.    Patient is advised the following: Patient Instructions  We don't know what the cause of the thyroid lump was, but no treatment is needed now.  Please come back for a  follow-up appointment in 2 months.

## 2015-01-19 DIAGNOSIS — E041 Nontoxic single thyroid nodule: Secondary | ICD-10-CM | POA: Insufficient documentation

## 2015-03-19 ENCOUNTER — Ambulatory Visit: Payer: BLUE CROSS/BLUE SHIELD | Admitting: Endocrinology

## 2015-06-11 ENCOUNTER — Ambulatory Visit (INDEPENDENT_AMBULATORY_CARE_PROVIDER_SITE_OTHER): Payer: BLUE CROSS/BLUE SHIELD | Admitting: Women's Health

## 2015-06-11 ENCOUNTER — Encounter: Payer: Self-pay | Admitting: Women's Health

## 2015-06-11 VITALS — BP 122/80 | Ht 64.0 in | Wt 115.0 lb

## 2015-06-11 DIAGNOSIS — N926 Irregular menstruation, unspecified: Secondary | ICD-10-CM | POA: Diagnosis not present

## 2015-06-11 DIAGNOSIS — Z01419 Encounter for gynecological examination (general) (routine) without abnormal findings: Secondary | ICD-10-CM | POA: Diagnosis not present

## 2015-06-11 LAB — CBC WITH DIFFERENTIAL/PLATELET
BASOS PCT: 1 %
Basophils Absolute: 57 cells/uL (ref 0–200)
EOS PCT: 5 %
Eosinophils Absolute: 285 cells/uL (ref 15–500)
HCT: 36.8 % (ref 35.0–45.0)
HEMOGLOBIN: 12.1 g/dL (ref 11.7–15.5)
LYMPHS ABS: 1767 {cells}/uL (ref 850–3900)
Lymphocytes Relative: 31 %
MCH: 28.5 pg (ref 27.0–33.0)
MCHC: 32.9 g/dL (ref 32.0–36.0)
MCV: 86.8 fL (ref 80.0–100.0)
MONOS PCT: 8 %
MPV: 10.2 fL (ref 7.5–12.5)
Monocytes Absolute: 456 cells/uL (ref 200–950)
NEUTROS ABS: 3135 {cells}/uL (ref 1500–7800)
Neutrophils Relative %: 55 %
Platelets: 224 10*3/uL (ref 140–400)
RBC: 4.24 MIL/uL (ref 3.80–5.10)
RDW: 13.7 % (ref 11.0–15.0)
WBC: 5.7 10*3/uL (ref 3.8–10.8)

## 2015-06-11 LAB — TSH: TSH: 1.26 m[IU]/L

## 2015-06-11 LAB — PREGNANCY, URINE: PREG TEST UR: NEGATIVE

## 2015-06-11 NOTE — Patient Instructions (Signed)
Health Maintenance, Female Adopting a healthy lifestyle and getting preventive care can go a long way to promote health and wellness. Talk with your health care provider about what schedule of regular examinations is right for you. This is a good chance for you to check in with your provider about disease prevention and staying healthy. In between checkups, there are plenty of things you can do on your own. Experts have done a lot of research about which lifestyle changes and preventive measures are most likely to keep you healthy. Ask your health care provider for more information. WEIGHT AND DIET  Eat a healthy diet  Be sure to include plenty of vegetables, fruits, low-fat dairy products, and lean protein.  Do not eat a lot of foods high in solid fats, added sugars, or salt.  Get regular exercise. This is one of the most important things you can do for your health.  Most adults should exercise for at least 150 minutes each week. The exercise should increase your heart rate and make you sweat (moderate-intensity exercise).  Most adults should also do strengthening exercises at least twice a week. This is in addition to the moderate-intensity exercise.  Maintain a healthy weight  Body mass index (BMI) is a measurement that can be used to identify possible weight problems. It estimates body fat based on height and weight. Your health care provider can help determine your BMI and help you achieve or maintain a healthy weight.  For females 20 years of age and older:   A BMI below 18.5 is considered underweight.  A BMI of 18.5 to 24.9 is normal.  A BMI of 25 to 29.9 is considered overweight.  A BMI of 30 and above is considered obese.  Watch levels of cholesterol and blood lipids  You should start having your blood tested for lipids and cholesterol at 27 years of age, then have this test every 5 years.  You may need to have your cholesterol levels checked more often if:  Your lipid  or cholesterol levels are high.  You are older than 27 years of age.  You are at high risk for heart disease.  CANCER SCREENING   Lung Cancer  Lung cancer screening is recommended for adults 55-80 years old who are at high risk for lung cancer because of a history of smoking.  A yearly low-dose CT scan of the lungs is recommended for people who:  Currently smoke.  Have quit within the past 15 years.  Have at least a 30-pack-year history of smoking. A pack year is smoking an average of one pack of cigarettes a day for 1 year.  Yearly screening should continue until it has been 15 years since you quit.  Yearly screening should stop if you develop a health problem that would prevent you from having lung cancer treatment.  Breast Cancer  Practice breast self-awareness. This means understanding how your breasts normally appear and feel.  It also means doing regular breast self-exams. Let your health care provider know about any changes, no matter how small.  If you are in your 20s or 30s, you should have a clinical breast exam (CBE) by a health care provider every 1-3 years as part of a regular health exam.  If you are 40 or older, have a CBE every year. Also consider having a breast X-ray (mammogram) every year.  If you have a family history of breast cancer, talk to your health care provider about genetic screening.  If you   are at high risk for breast cancer, talk to your health care provider about having an MRI and a mammogram every year.  Breast cancer gene (BRCA) assessment is recommended for women who have family members with BRCA-related cancers. BRCA-related cancers include:  Breast.  Ovarian.  Tubal.  Peritoneal cancers.  Results of the assessment will determine the need for genetic counseling and BRCA1 and BRCA2 testing. Cervical Cancer Your health care provider may recommend that you be screened regularly for cancer of the pelvic organs (ovaries, uterus, and  vagina). This screening involves a pelvic examination, including checking for microscopic changes to the surface of your cervix (Pap test). You may be encouraged to have this screening done every 3 years, beginning at age 21.  For women ages 30-65, health care providers may recommend pelvic exams and Pap testing every 3 years, or they may recommend the Pap and pelvic exam, combined with testing for human papilloma virus (HPV), every 5 years. Some types of HPV increase your risk of cervical cancer. Testing for HPV may also be done on women of any age with unclear Pap test results.  Other health care providers may not recommend any screening for nonpregnant women who are considered low risk for pelvic cancer and who do not have symptoms. Ask your health care provider if a screening pelvic exam is right for you.  If you have had past treatment for cervical cancer or a condition that could lead to cancer, you need Pap tests and screening for cancer for at least 20 years after your treatment. If Pap tests have been discontinued, your risk factors (such as having a new sexual partner) need to be reassessed to determine if screening should resume. Some women have medical problems that increase the chance of getting cervical cancer. In these cases, your health care provider may recommend more frequent screening and Pap tests. Colorectal Cancer  This type of cancer can be detected and often prevented.  Routine colorectal cancer screening usually begins at 27 years of age and continues through 27 years of age.  Your health care provider may recommend screening at an earlier age if you have risk factors for colon cancer.  Your health care provider may also recommend using home test kits to check for hidden blood in the stool.  A small camera at the end of a tube can be used to examine your colon directly (sigmoidoscopy or colonoscopy). This is done to check for the earliest forms of colorectal  cancer.  Routine screening usually begins at age 50.  Direct examination of the colon should be repeated every 5-10 years through 27 years of age. However, you may need to be screened more often if early forms of precancerous polyps or small growths are found. Skin Cancer  Check your skin from head to toe regularly.  Tell your health care provider about any new moles or changes in moles, especially if there is a change in a mole's shape or color.  Also tell your health care provider if you have a mole that is larger than the size of a pencil eraser.  Always use sunscreen. Apply sunscreen liberally and repeatedly throughout the day.  Protect yourself by wearing long sleeves, pants, a wide-brimmed hat, and sunglasses whenever you are outside. HEART DISEASE, DIABETES, AND HIGH BLOOD PRESSURE   High blood pressure causes heart disease and increases the risk of stroke. High blood pressure is more likely to develop in:  People who have blood pressure in the high end   of the normal range (130-139/85-89 mm Hg).  People who are overweight or obese.  People who are African American.  If you are 38-23 years of age, have your blood pressure checked every 3-5 years. If you are 61 years of age or older, have your blood pressure checked every year. You should have your blood pressure measured twice--once when you are at a hospital or clinic, and once when you are not at a hospital or clinic. Record the average of the two measurements. To check your blood pressure when you are not at a hospital or clinic, you can use:  An automated blood pressure machine at a pharmacy.  A home blood pressure monitor.  If you are between 45 years and 39 years old, ask your health care provider if you should take aspirin to prevent strokes.  Have regular diabetes screenings. This involves taking a blood sample to check your fasting blood sugar level.  If you are at a normal weight and have a low risk for diabetes,  have this test once every three years after 27 years of age.  If you are overweight and have a high risk for diabetes, consider being tested at a younger age or more often. PREVENTING INFECTION  Hepatitis B  If you have a higher risk for hepatitis B, you should be screened for this virus. You are considered at high risk for hepatitis B if:  You were born in a country where hepatitis B is common. Ask your health care provider which countries are considered high risk.  Your parents were born in a high-risk country, and you have not been immunized against hepatitis B (hepatitis B vaccine).  You have HIV or AIDS.  You use needles to inject street drugs.  You live with someone who has hepatitis B.  You have had sex with someone who has hepatitis B.  You get hemodialysis treatment.  You take certain medicines for conditions, including cancer, organ transplantation, and autoimmune conditions. Hepatitis C  Blood testing is recommended for:  Everyone born from [birth year] through 1967.  Anyone with known risk factors for hepatitis C. Sexually transmitted infections (STIs)  You should be screened for sexually transmitted infections (STIs) including gonorrhea and chlamydia if:  You are sexually active and are younger than 27 years of age.  You are older than 27 years of age and your health care provider tells you that you are at risk for this type of infection.  Your sexual activity has changed since you were last screened and you are at an increased risk for chlamydia or gonorrhea. Ask your health care provider if you are at risk.  If you do not have HIV, but are at risk, it may be recommended that you take a prescription medicine daily to prevent HIV infection. This is called pre-exposure prophylaxis (PrEP). You are considered at risk if:  You are sexually active and do not regularly use condoms or know the HIV status of your partner(s).  You take drugs by injection.  You are sexually  active with a partner who has HIV. Talk with your health care provider about whether you are at high risk of being infected with HIV. If you choose to begin PrEP, you should first be tested for HIV. You should then be tested every 3 months for as long as you are taking PrEP.  PREGNANCY   If you are premenopausal and you may become pregnant, ask your health care provider about preconception counseling.  If you may  become pregnant, take 400 to 800 micrograms (mcg) of folic acid every day.  If you want to prevent pregnancy, talk to your health care provider about birth control (contraception). OSTEOPOROSIS AND MENOPAUSE   Osteoporosis is a disease in which the bones lose minerals and strength with aging. This can result in serious bone fractures. Your risk for osteoporosis can be identified using a bone density scan.  If you are 33 years of age or older, or if you are at risk for osteoporosis and fractures, ask your health care provider if you should be screened.  Ask your health care provider whether you should take a calcium or vitamin D supplement to lower your risk for osteoporosis.  Menopause may have certain physical symptoms and risks.  Hormone replacement therapy may reduce some of these symptoms and risks. Talk to your health care provider about whether hormone replacement therapy is right for you.  HOME CARE INSTRUCTIONS   Schedule regular health, dental, and eye exams.  Stay current with your immunizations.   Do not use any tobacco products including cigarettes, chewing tobacco, or electronic cigarettes.  If you are pregnant, do not drink alcohol.  If you are breastfeeding, limit how much and how often you drink alcohol.  Limit alcohol intake to no more than 1 drink per day for nonpregnant women. One drink equals 12 ounces of beer, 5 ounces of wine, or 1 ounces of hard liquor.  Do not use street drugs.  Do not share needles.  Ask your health care provider for help if  you need support or information about quitting drugs.  Tell your health care provider if you often feel depressed.  Tell your health care provider if you have ever been abused or do not feel safe at home.   This information is not intended to replace advice given to you by your health care provider. Make sure you discuss any questions you have with your health care provider.   Document Released: 09/01/2010 Document Revised: 03/09/2014 Document Reviewed: 01/18/2013 Elsevier Interactive Patient Education 2016 [place] What is this medicine? ETONOGESTREL (et oh noe JES trel) is a contraceptive (birth control) device. It is used to prevent pregnancy. It can be used for up to 3 years. This medicine may be used for other purposes; ask your health care provider or pharmacist if you have questions. What should I tell my health care provider before I take this medicine? They need to know if you have any of these conditions: -abnormal vaginal bleeding -blood vessel disease or blood clots -cancer of the breast, cervix, or liver -depression -diabetes -gallbladder disease -headaches -heart disease or recent heart attack -high blood pressure -high cholesterol -kidney disease -liver disease -renal disease -seizures -tobacco smoker -an unusual or allergic reaction to etonogestrel, other hormones, anesthetics or antiseptics, medicines, foods, dyes, or preservatives -pregnant or trying to get pregnant -breast-feeding How should I use this medicine? This device is inserted just under the skin on the inner side of your upper arm by a health care professional. Talk to your pediatrician regarding the use of this medicine in children. Special care may be needed. Overdosage: If you think you have taken too much of this medicine contact a poison control center or emergency room at once. NOTE: This medicine is only for you. Do not share this medicine with others. What if I  miss a dose? This does not apply. What may interact with this medicine? Do not take this medicine with any of the  following medications: -amprenavir -bosentan -fosamprenavir This medicine may also interact with the following medications: -barbiturate medicines for inducing sleep or treating seizures -certain medicines for fungal infections like ketoconazole and itraconazole -griseofulvin -medicines to treat seizures like carbamazepine, felbamate, oxcarbazepine, phenytoin, topiramate -modafinil -phenylbutazone -rifampin -some medicines to treat HIV infection like atazanavir, indinavir, lopinavir, nelfinavir, tipranavir, ritonavir -St. John's wort This list may not describe all possible interactions. Give your health care provider a list of all the medicines, herbs, non-prescription drugs, or dietary supplements you use. Also tell them if you smoke, drink alcohol, or use illegal drugs. Some items may interact with your medicine. What should I watch for while using this medicine? This product does not protect you against HIV infection (AIDS) or other sexually transmitted diseases. You should be able to feel the implant by pressing your fingertips over the skin where it was inserted. Contact your doctor if you cannot feel the implant, and use a non-hormonal birth control method (such as condoms) until your doctor confirms that the implant is in place. If you feel that the implant may have broken or become bent while in your arm, contact your healthcare provider. What side effects may I notice from receiving this medicine? Side effects that you should report to your doctor or health care professional as soon as possible: -allergic reactions like skin rash, itching or hives, swelling of the face, lips, or tongue -breast lumps -changes in emotions or moods -depressed mood -heavy or prolonged menstrual bleeding -pain, irritation, swelling, or bruising at the insertion site -scar at site of  insertion -signs of infection at the insertion site such as fever, and skin redness, pain or discharge -signs of pregnancy -signs and symptoms of a blood clot such as breathing problems; changes in vision; chest pain; severe, sudden headache; pain, swelling, warmth in the leg; trouble speaking; sudden numbness or weakness of the face, arm or leg -signs and symptoms of liver injury like dark yellow or brown urine; general ill feeling or flu-like symptoms; light-colored stools; loss of appetite; nausea; right upper belly pain; unusually weak or tired; yellowing of the eyes or skin -unusual vaginal bleeding, discharge -signs and symptoms of a stroke like changes in vision; confusion; trouble speaking or understanding; severe headaches; sudden numbness or weakness of the face, arm or leg; trouble walking; dizziness; loss of balance or coordination Side effects that usually do not require medical attention (Report these to your doctor or health care professional if they continue or are bothersome.): -acne -back pain -breast pain -changes in weight -dizziness -general ill feeling or flu-like symptoms -headache -irregular menstrual bleeding -nausea -sore throat -vaginal irritation or inflammation This list may not describe all possible side effects. Call your doctor for medical advice about side effects. You may report side effects to FDA at 1-800-FDA-1088. Where should I keep my medicine? This drug is given in a hospital or clinic and will not be stored at home. NOTE: This sheet is a summary. It may not cover all possible information. If you have questions about this medicine, talk to your doctor, pharmacist, or health care provider.    2016, Elsevier/Gold Standard. (2013-12-01 14:07:06)

## 2015-06-11 NOTE — Progress Notes (Signed)
[name] [name] 04-18-1990 [id number]    History:    Presents for annual exam.  Regular monthly cycle until this month. Has had irregular spotting for the past 18 days after normal cycle. Scheduled for a Nexplanon at primary care next month.  Has not had a Pap . Did not receive gardasil. Has had intercourse twice with a condom 3 months ago, not sexually active greater than 4 years prior. Anxiety and depression stable on Cymbalta per primary care. Numerous family members have anxiety and depression.   Past medical history, past surgical history, family history and social history were all reviewed and documented in the EPIC chart. 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   ROS:  A ROS was performed and pertinent positives and negatives are included.  Exam:  Filed Vitals:   06/11/15 1539  BP: 122/80    General appearance:  Normal Thyroid:  Symmetrical, normal in size, without palpable masses or nodularity. Respiratory  Auscultation:  Clear without wheezing or rhonchi Cardiovascular  Auscultation:  Regular rate, without rubs, murmurs or gallops  Edema/varicosities:  Not grossly evident Abdominal  Soft,nontender, without masses, guarding or rebound.  Liver/spleen:  No organomegaly noted  Hernia:  None appreciated  Skin  Inspection:  Grossly normal   Breasts: Examined lying and sitting.     Right: Without masses, retractions, discharge or axillary adenopathy.     Left: Without masses, retractions, discharge or axillary adenopathy. Gentitourinary   Inguinal/mons:  Normal without inguinal adenopathy  External genitalia:  Normal  BUS/Urethra/Skene's glands:  Normal  Vagina:  NormalScant menses  Cervix:  Normal  Uterus:   normal in size, shape and contour.  Midline and mobile  Adnexa/parametria:     Rt: Without masses or tenderness.   Lt: Without masses or tenderness.  Anus and perineum: Normal   Assessment/Plan:  25 y.o. S WF G0 for annual  exam.    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  If all normal will watch at this time bleeding minimal today. Reviewed if spotting persists ultrasound.  Has Nexplanon scheduled with primary care, reviewed may have irregular bleeding. SBE's, exercise, calcium rich diet, continue active lifestyle, MVI daily encouraged.Marland Kitchen.    Harrington ChallengerYOUNG,NANCY J Jefferson County HospitalWHNP, 5:13 PM 06/11/2015

## 2015-06-12 LAB — GC/CHLAMYDIA PROBE AMP
CT PROBE, AMP APTIMA: NOT DETECTED
GC PROBE AMP APTIMA: NOT DETECTED

## 2015-06-12 LAB — PAP IG W/ RFLX HPV ASCU

## 2015-06-12 LAB — PROLACTIN: PROLACTIN: 9.3 ng/mL

## 2015-07-16 ENCOUNTER — Encounter: Payer: Self-pay | Admitting: Emergency Medicine

## 2015-07-16 ENCOUNTER — Emergency Department
Admission: EM | Admit: 2015-07-16 | Discharge: 2015-07-16 | Disposition: A | Discharge status: Home / Self Care | Payer: BLUE CROSS/BLUE SHIELD | Attending: Emergency Medicine | Admitting: Emergency Medicine

## 2015-07-16 DIAGNOSIS — J45909 Unspecified asthma, uncomplicated: Secondary | ICD-10-CM | POA: Diagnosis not present

## 2015-07-16 DIAGNOSIS — R42 Dizziness and giddiness: Secondary | ICD-10-CM | POA: Diagnosis present

## 2015-07-16 DIAGNOSIS — Z79899 Other long term (current) drug therapy: Secondary | ICD-10-CM | POA: Diagnosis not present

## 2015-07-16 DIAGNOSIS — A084 Viral intestinal infection, unspecified: Secondary | ICD-10-CM | POA: Insufficient documentation

## 2015-07-16 LAB — URINALYSIS COMPLETE WITH MICROSCOPIC (ARMC ONLY)
BACTERIA UA: NONE SEEN
Bilirubin Urine: NEGATIVE
GLUCOSE, UA: NEGATIVE mg/dL
Ketones, ur: NEGATIVE mg/dL
LEUKOCYTES UA: NEGATIVE
Nitrite: NEGATIVE
PROTEIN: 30 mg/dL — AB
SPECIFIC GRAVITY, URINE: 1.025 (ref 1.005–1.030)
pH: 5 (ref 5.0–8.0)

## 2015-07-16 LAB — COMPREHENSIVE METABOLIC PANEL
ALK PHOS: 52 U/L (ref 38–126)
ALT: 15 U/L (ref 14–54)
ANION GAP: 9 (ref 5–15)
AST: 21 U/L (ref 15–41)
Albumin: 4.7 g/dL (ref 3.5–5.0)
BILIRUBIN TOTAL: 0.8 mg/dL (ref 0.3–1.2)
BUN: 14 mg/dL (ref 6–20)
CALCIUM: 9 mg/dL (ref 8.9–10.3)
CO2: 27 mmol/L (ref 22–32)
CREATININE: 0.79 mg/dL (ref 0.44–1.00)
Chloride: 97 mmol/L — ABNORMAL LOW (ref 101–111)
GFR calc non Af Amer: >60 mL/min (ref >60)
Glucose, Bld: 105 mg/dL — ABNORMAL HIGH (ref 65–99)
Potassium: 3.5 mmol/L (ref 3.5–5.1)
Sodium: 133 mmol/L — ABNORMAL LOW (ref 135–145)
TOTAL PROTEIN: 8.3 g/dL — AB (ref 6.5–8.1)

## 2015-07-16 LAB — POCT PREGNANCY, URINE: Preg Test, Ur: NEGATIVE

## 2015-07-16 LAB — CBC WITH DIFFERENTIAL/PLATELET
Basophils Absolute: 0 10*3/uL (ref 0–0.1)
Basophils Relative: 1 %
Eosinophils Absolute: 0.3 10*3/uL (ref 0–0.7)
Eosinophils Relative: 11 %
HCT: 42.1 % (ref 35.0–47.0)
HEMOGLOBIN: 14.3 g/dL (ref 12.0–16.0)
LYMPHS ABS: 0.9 10*3/uL — AB (ref 1.0–3.6)
Lymphocytes Relative: 28 %
MCH: 29.2 pg (ref 26.0–34.0)
MCHC: 33.9 g/dL (ref 32.0–36.0)
MCV: 86.1 fL (ref 80.0–100.0)
Monocytes Absolute: 0.4 10*3/uL (ref 0.2–0.9)
Monocytes Relative: 12 %
NEUTROS ABS: 1.5 10*3/uL (ref 1.4–6.5)
Platelets: 186 10*3/uL (ref 150–440)
RBC: 4.89 MIL/uL (ref 3.80–5.20)
RDW: 13.5 % (ref 11.5–14.5)
WBC: 3.1 10*3/uL — AB (ref 3.6–11.0)

## 2015-07-16 MED ORDER — ONDANSETRON HCL 4 MG PO TABS: ORAL_TABLET | ORAL | Status: DC

## 2015-07-16 MED ORDER — SODIUM CHLORIDE 0.9 % IV BOLUS (SEPSIS)
1000.0000 mL | Freq: Once | INTRAVENOUS | Status: AC
  Administered 2015-07-16: 1000 mL via INTRAVENOUS

## 2015-07-16 NOTE — ED Provider Notes (Signed)
[facility] Emergency Department Provider Note  ____________________________________________  Time seen: Approximately 2:56 PM  I have reviewed the triage vital signs and the nursing notes.   HISTORY  Chief Complaint Dizziness    HPI [name] [name] is a 25 y.o. female who reports a past medical history of POTS who presents feeling lightheaded and dizzy after gradual onset of 2 days of "stomach flu".  She reports that another family member had these symptoms and came over to visit several days ago.  She really thereafter she developed gradual onset of nausea, then vomiting, then diarrhea.  She had numerous episodes of vomiting 2 days ago, then that improved, and now she is still having numerous episodes of watery, nonbloody diarrhea, although it is much better today.  She has been trying to stay hydrated, particularly with a history of POTS, but today she feels fatigue, generalized weakness, and some dizziness when she stands up.  Given her history she is familiar with the symptoms and states that she knows she needs some IV fluids.  She is also concerned her electrolytes may be abnormal given the GI output.  Other than the symptoms described above, she denies any other symptoms, specifically denying fever/chills, chest pain, shortness of breath, abdominal pain, dysuria.  She has a mild "throbbing" headache which is generalized and also consistent with volume depletion in her past experience.  Past Medical History  Diagnosis Date  . Seizures (HCC)   . Chest pain   . Pott's disease     Patient Active Problem List   Diagnosis Date Noted  . Right thyroid nodule 01/19/2015  . POTS (postural orthostatic tachycardia syndrome) 11/23/2013  . Palpitations 11/23/2013  . Chest pain   . Nausea and vomiting 11/02/2011  . Vertigo 11/01/2011  . Labyrinthitis 11/01/2011  . Seizure disorder (HCC) 11/01/2011  . Asthma 11/01/2011    Past Surgical History  Procedure  Laterality Date  . Tonsillectomy      Current Outpatient Rx  Name  Route  Sig  Dispense  Refill  . albuterol (PROVENTIL HFA;VENTOLIN HFA) 108 (90 BASE) MCG/ACT inhaler   Inhalation   Inhale into the lungs every 6 (six) hours as needed for wheezing or shortness of breath.         . DULoxetine (CYMBALTA) 60 MG capsule   Oral   Take 60 mg by mouth daily.         . fludrocortisone (FLORINEF) 0.1 MG tablet   Oral   Take 0.2 mg by mouth daily.         . ondansetron (ZOFRAN) 4 MG tablet      Take 1-2 tabs by mouth every 8 hours as needed for nausea/vomiting   30 tablet   0     Allergies Penicillins and Amoxicillin  Family History  Problem Relation Age of Onset  . Thyroid disease Brother     hashimoto's thyroiditis  . Thyroid disease Sister     Social History Social History  Substance Use Topics  . Smoking status: Never Smoker   . Smokeless tobacco: None  . Alcohol Use: 0.0 oz/week    0 Standard drinks or equivalent per week     Comment: rarely    Review of Systems Constitutional: No fever/chills Eyes: No visual changes. ENT: No sore throat. Cardiovascular: Denies chest pain. Respiratory: Denies shortness of breath. Gastrointestinal: No abdominal pain.  +N/V/D x 2 days Genitourinary: Negative for dysuria. Musculoskeletal: Negative for back pain. Skin: Negative for rash. Neurological: Negative  for headaches, focal weakness or numbness.  Generalized weakness/fatigue  10-point ROS otherwise negative.  ____________________________________________   PHYSICAL EXAM:  VITAL SIGNS: ED Triage Vitals  Enc Vitals Group     BP 07/16/15 1224 122/90 mmHg     Pulse Rate 07/16/15 1224 97     Resp 07/16/15 1224 18     Temp 07/16/15 1224 98 F (36.7 C)     Temp Source 07/16/15 1224 Oral     SpO2 07/16/15 1224 98 %     Weight 07/16/15 1224 120 lb (54.432 kg)     Height 07/16/15 1224 5\' 4"  (1.626 m)     Head Cir --      Peak Flow --      Pain Score 07/16/15  1225 0     Pain Loc --      Pain Edu? --      Excl. in GC? --     Constitutional: Alert and oriented. Well appearing and in no acute distress. Eyes: Conjunctivae are normal. PERRL. EOMI. Head: Atraumatic. Nose: No congestion/rhinnorhea. Mouth/Throat: Mucous membranes are moist.  Oropharynx non-erythematous. Neck: No stridor.  No meningeal signs.   Cardiovascular: Normal rate, regular rhythm. Good peripheral circulation. Grossly normal heart sounds.   Respiratory: Normal respiratory effort.  No retractions. Lungs CTAB. Gastrointestinal: Soft and nontender. No distention.  Musculoskeletal: No lower extremity tenderness nor edema. No gross deformities of extremities. Neurologic:  Normal speech and language. No gross focal neurologic deficits are appreciated.  Skin:  Skin is warm, dry and intact. No rash noted. Psychiatric: Mood and affect are normal. Speech and behavior are normal.  ____________________________________________   LABS (all labs ordered are listed, but only abnormal results are displayed)  Labs Reviewed  CBC WITH DIFFERENTIAL/PLATELET - Abnormal; Notable for the following:    WBC 3.1 (*)    Lymphs Abs 0.9 (*)    All other components within normal limits  COMPREHENSIVE METABOLIC PANEL - Abnormal; Notable for the following:    Sodium 133 (*)    Chloride 97 (*)    Glucose, Bld 105 (*)    Total Protein 8.3 (*)    All other components within normal limits  URINALYSIS COMPLETEWITH MICROSCOPIC ([facility] ONLY) - Abnormal; Notable for the following:    Color, Urine YELLOW (*)    APPearance CLEAR (*)    Hgb urine dipstick 1+ (*)    Protein, ur 30 (*)    Squamous Epithelial / LPF 0-5 (*)    All other components within normal limits  POC URINE PREG, ED  POCT PREGNANCY, URINE   ____________________________________________  EKG  ED ECG REPORT I, Richardine Peppers, the attending physician, personally viewed and interpreted this ECG.  Date: 07/16/2015 EKG Time: 12:42 Rate:  88 Rhythm: normal sinus rhythm QRS Axis: normal Intervals: Short PR interval of 104 ms, normal QTC of 428 ms ST/T Wave abnormalities: normal Conduction Disturbances: none Narrative Interpretation: unremarkable  ____________________________________________  RADIOLOGY   No results found.  ____________________________________________   PROCEDURES  Procedure(s) performed: None  Critical Care performed: No ____________________________________________   INITIAL IMPRESSION / ASSESSMENT AND PLAN / ED COURSE  Pertinent labs & imaging results that were available during my care of the patient were reviewed by me and considered in my medical decision making (see chart for details).  Reassuring labs, vital signs, and EKG.  Given her history of present illness as well as medical history it is very reasonable to provide her a liter of fluids.  She states that is  all she thinks she needs.  I gave her my usual and customary management recommendations and return precautions, and she agrees with the plan.   ____________________________________________  FINAL CLINICAL IMPRESSION(S) / ED DIAGNOSES  Final diagnoses:  Viral gastroenteritis     MEDICATIONS GIVEN DURING THIS VISIT:  Medications  sodium chloride 0.9 % bolus 1,000 mL (1,000 mLs Intravenous New Bag/Given 07/16/15 1441)     NEW OUTPATIENT MEDICATIONS STARTED DURING THIS VISIT:  New Prescriptions   ONDANSETRON (ZOFRAN) 4 MG TABLET    Take 1-2 tabs by mouth every 8 hours as needed for nausea/vomiting      Note:  This document was prepared using Dragon voice recognition software and may include unintentional dictation errors.   Loleta Rose, MD 07/16/15 [phone]

## 2015-07-16 NOTE — ED Notes (Signed)
Pt informed to return if any life threatening symptoms occur.  

## 2015-07-16 NOTE — Discharge Instructions (Signed)
We believe your symptoms are caused by either a viral infection or possible a bad food exposure.  Either way, since your symptoms have improved, we feel it is safe for you to go home and follow up with your regular doctor.  Please read the included information and stick to a bland diet for the next two days.  Drink plenty of clear fluids, and if you were provided with a prescription, please take it according to the label instructions.   ° °If you develop any new or worsening symptoms, including persistent vomiting not controlled with medication, fever greater than 101, severe or worsening abdominal pain, or other symptoms that concern you, please return immediately to the Emergency Department. ° °Viral Gastroenteritis  °Viral gastroenteritis is also known as stomach flu. This condition affects the stomach and intestinal tract. It can cause sudden diarrhea and vomiting. The illness typically lasts 3 to 8 days. Most people develop an immune response that eventually gets rid of the virus. While this natural response develops, the virus can make you quite ill.  °CAUSES  °Many different viruses can cause gastroenteritis, such as rotavirus or noroviruses. You can catch one of these viruses by consuming contaminated food or water. You may also catch a virus by sharing utensils or other personal items with an infected person or by touching a contaminated surface.  °SYMPTOMS  °The most common symptoms are diarrhea and vomiting. These problems can cause a severe loss of body fluids (dehydration) and a body salt (electrolyte) imbalance. Other symptoms may include:  °Fever.  °Headache.  °Fatigue.  °Abdominal pain. °DIAGNOSIS  °Your caregiver can usually diagnose viral gastroenteritis based on your symptoms and a physical exam. A stool sample may also be taken to test for the presence of viruses or other infections.  °TREATMENT  °This illness typically goes away on its own. Treatments are aimed at rehydration. The most serious  cases of viral gastroenteritis involve vomiting so severely that you are not able to keep fluids down. In these cases, fluids must be given through an intravenous line (IV).  °HOME CARE INSTRUCTIONS  °Drink enough fluids to keep your urine clear or pale yellow. Drink small amounts of fluids frequently and increase the amounts as tolerated.  °Ask your caregiver for specific rehydration instructions.  °Avoid:  °Foods high in sugar.  °Alcohol.  °Carbonated drinks.  °Tobacco.  °Juice.  °Caffeine drinks.  °Extremely hot or cold fluids.  °Fatty, greasy foods.  °Too much intake of anything at one time.  °Dairy products until 24 to 48 hours after diarrhea stops. °You may consume probiotics. Probiotics are active cultures of beneficial bacteria. They may lessen the amount and number of diarrheal stools in adults. Probiotics can be found in yogurt with active cultures and in supplements.  °Wash your hands well to avoid spreading the virus.  °Only take over-the-counter or prescription medicines for pain, discomfort, or fever as directed by your caregiver. Do not give aspirin to children. Antidiarrheal medicines are not recommended.  °Ask your caregiver if you should continue to take your regular prescribed and over-the-counter medicines.  °Keep all follow-up appointments as directed by your caregiver. °SEEK IMMEDIATE MEDICAL CARE IF:  °You are unable to keep fluids down.  °You do not urinate at least once every 6 to 8 hours.  °You develop shortness of breath.  °You notice blood in your stool or vomit. This may look like coffee grounds.  °You have abdominal pain that increases or is concentrated in one small area (  localized). °· You have persistent vomiting or diarrhea. °· You have a fever. °· The patient is a child younger than 3 months, and he or she has a fever. °· The patient is a child older than 3 months, and he or she has a fever and persistent symptoms. °· The patient is a child older  than 3 months, and he or she has a fever and symptoms suddenly get worse. °· The patient is a baby, and he or she has no tears when crying. °MAKE SURE YOU:  °· Understand these instructions. °· Will watch your condition. °· Will get help right away if you are not doing well or get worse. °  °This information is not intended to replace advice given to you by your health care provider. Make sure you discuss any questions you have with your health care provider. °  °Document Released: 02/16/2005 Document Revised: 05/11/2011 Document Reviewed: 12/03/2010 °Elsevier Interactive Patient Education ©2016 Elsevier Inc. ° °Food Choices to Help Relieve Diarrhea, Adult °When you have diarrhea, the foods you eat and your eating habits are very important. Choosing the right foods and drinks can help relieve diarrhea. Also, because diarrhea can last up to 7 days, you need to replace lost fluids and electrolytes (such as sodium, potassium, and chloride) in order to help prevent dehydration.  °WHAT GENERAL GUIDELINES DO I NEED TO FOLLOW? °· Slowly drink 1 cup (8 oz) of fluid for each episode of diarrhea. If you are getting enough fluid, your urine will be clear or pale yellow. °· Eat starchy foods. Some good choices include white rice, white toast, pasta, low-fiber cereal, baked potatoes (without the skin), saltine crackers, and bagels. °· Avoid large servings of any cooked vegetables. °· Limit fruit to two servings per day. A serving is ½ cup or 1 small piece. °· Choose foods with less than 2 g of fiber per serving. °· Limit fats to less than 8 tsp (38 g) per day. °· Avoid fried foods. °· Eat foods that have probiotics in them. Probiotics can be found in certain dairy products. °· Avoid foods and beverages that may increase the speed at which food moves through the stomach and intestines (gastrointestinal tract). Things to avoid include: °¨ High-fiber foods, such as dried fruit, raw fruits and vegetables, nuts, seeds, and whole  grain foods. °¨ Spicy foods and high-fat foods. °¨ Foods and beverages sweetened with high-fructose corn syrup, honey, or sugar alcohols such as xylitol, sorbitol, and mannitol. °WHAT FOODS ARE RECOMMENDED? °Grains °White rice. White, French, or pita breads (fresh or toasted), including plain rolls, buns, or bagels. White pasta. Saltine, soda, or graham crackers. Pretzels. Low-fiber cereal. Cooked cereals made with water (such as cornmeal, farina, or cream cereals). Plain muffins. Matzo. Melba toast. Zwieback.  °Vegetables °Potatoes (without the skin). Strained tomato and vegetable juices. Most well-cooked and canned vegetables without seeds. Tender lettuce. °Fruits °Cooked or canned applesauce, apricots, cherries, fruit cocktail, grapefruit, peaches, pears, or plums. Fresh bananas, apples without skin, cherries, grapes, cantaloupe, grapefruit, peaches, oranges, or plums.  °Meat and Other Protein Products °Baked or boiled chicken. Eggs. Tofu. Fish. Seafood. Smooth peanut butter. Ground or well-cooked tender beef, ham, veal, lamb, pork, or poultry.  °Dairy °Plain yogurt, kefir, and unsweetened liquid yogurt. Lactose-free milk, buttermilk, or soy milk. Plain hard cheese. °Beverages °Sport drinks. Clear broths. Diluted fruit juices (except prune). Regular, caffeine-free sodas such as ginger ale. Water. Decaffeinated teas. Oral rehydration solutions. Sugar-free beverages not sweetened with sugar alcohols. °Other °Bouillon, broth,   soups made from recommended foods.  The items listed above may not be a complete list of recommended foods or beverages. Contact your dietitian for more options. WHAT FOODS ARE NOT RECOMMENDED? Grains Whole grain, whole wheat, bran, or rye breads, rolls, pastas, crackers, and cereals. Wild or brown rice. Cereals that contain more than 2 g of fiber per serving. Corn tortillas or taco shells. Cooked or dry oatmeal. Granola. Popcorn. Vegetables Raw vegetables. Cabbage, broccoli, Brussels sprouts, artichokes, baked beans, beet  greens, corn, kale, legumes, peas, sweet potatoes, and yams. Potato skins. Cooked spinach and cabbage. Fruits Dried fruit, including raisins and dates. Raw fruits. Stewed or dried prunes. Fresh apples with skin, apricots, mangoes, pears, raspberries, and strawberries.  Meat and Other Protein Products Chunky peanut butter. Nuts and seeds. Beans and lentils. Tomasa BlaseBacon.  Dairy High-fat cheeses. Milk, chocolate milk, and beverages made with milk, such as milk shakes. Cream. Ice cream. Sweets and Desserts Sweet rolls, doughnuts, and sweet breads. Pancakes and waffles. Fats and Oils Butter. Cream sauces. Margarine. Salad oils. Plain salad dressings. Olives. Avocados.  Beverages Caffeinated beverages (such as coffee, tea, soda, or energy drinks). Alcoholic beverages. Fruit juices with pulp. Prune juice. Soft drinks sweetened with high-fructose corn syrup or sugar alcohols. Other Coconut. Hot sauce. Chili powder. Mayonnaise. Gravy. Cream-based or milk-based soups.  The items listed above may not be a complete list of foods and beverages to avoid. Contact your dietitian for more information. WHAT SHOULD I DO IF I BECOME DEHYDRATED? Diarrhea can sometimes lead to dehydration. Signs of dehydration include dark urine and dry mouth and skin. If you think you are dehydrated, you should rehydrate with an oral rehydration solution. These solutions can be purchased at pharmacies, retail stores, or online.  Drink -1 cup (120-240 mL) of oral rehydration solution each time you have an episode of diarrhea. If drinking this amount makes your diarrhea worse, try drinking smaller amounts more often. For example, drink 1-3 tsp (5-15 mL) every 5-10 minutes.  A general rule for staying hydrated is to drink 1-2 L of fluid per day. Talk to your health care provider about the specific amount you should be drinking each day. Drink enough fluids to keep your urine clear or pale yellow.   This information is not intended to  replace advice given to you by your health care provider. Make sure you discuss any questions you have with your health care provider.   Document Released: 05/09/2003 Document Revised: 03/09/2014 Document Reviewed: 01/09/2013 Elsevier Interactive Patient Education [employer]2016 Elsevier Inc.

## 2015-07-16 NOTE — ED Notes (Signed)
Patient states she has had the stomach flu for the past 2 days.  States when sitting up she feels lightheaded and standing up feels like "tunnle vision".  Also headache and dizziness.  Patient has a history of POTS syndrome.  Cardiologist is concerned about potassium.  No vomiting or diarrhea since yesterday.  Patient is tolerating po intake today.

## 2016-05-08 ENCOUNTER — Ambulatory Visit: Payer: BLUE CROSS/BLUE SHIELD | Admitting: Women's Health

## 2016-05-12 ENCOUNTER — Ambulatory Visit: Payer: BLUE CROSS/BLUE SHIELD | Admitting: Women's Health

## 2016-05-14 ENCOUNTER — Ambulatory Visit (INDEPENDENT_AMBULATORY_CARE_PROVIDER_SITE_OTHER): Payer: Self-pay | Admitting: Women's Health

## 2016-05-14 ENCOUNTER — Encounter: Payer: Self-pay | Admitting: Women's Health

## 2016-05-14 VITALS — BP 115/78

## 2016-05-14 DIAGNOSIS — N926 Irregular menstruation, unspecified: Secondary | ICD-10-CM

## 2016-05-14 DIAGNOSIS — Z975 Presence of (intrauterine) contraceptive device: Secondary | ICD-10-CM

## 2016-05-14 NOTE — Progress Notes (Signed)
Presents with complaint of irregular bleeding, mostly spotting and some lower right abdominal pressure with increased cramping with cycle. Reports pain with intercourse the past 2 weeks mostly related to dryness. Nexplanon placed 05/2015 per Dr. Uvaldo RisingMcNeil. Has had a  monthly light cycles since placement but this past month has had irregular spotting most of the month. Denies vaginal discharge with odor, itching or irritation. Denies urinary symptoms, change in bowel elimination or fever. Same partner with negative STD screen.  Exam: Appears well. Abdomen soft, no rebound, radiation, tenderness with exam.. External genitalia within normal limits, speculum exam no visible blood or brown discharge, scant white discharge no visible erythema, no odor noted, no pain with exam. Bimanual no CMT or adnexal tenderness. No pain elicited with exam.  Irregular spotting on Nexplanon placed 05/2015 Right-sided mild abdominal pain  Plan: Options reviewed will try and NuvaRing will  use continuously for one month sample given will call if spotting persists. Reviewed May also help with dryness, encouraged vaginal lubricant with intercourse. Reviewed irregular bleeding is common with Nexplanon. If right-sided pain persists instructed to call or return.

## 2016-06-15 ENCOUNTER — Encounter: Payer: Self-pay | Admitting: Women's Health

## 2016-06-24 ENCOUNTER — Ambulatory Visit (INDEPENDENT_AMBULATORY_CARE_PROVIDER_SITE_OTHER): Payer: Self-pay | Admitting: Women's Health

## 2016-06-24 ENCOUNTER — Encounter: Payer: Self-pay | Admitting: Women's Health

## 2016-06-24 DIAGNOSIS — Z01419 Encounter for gynecological examination (general) (routine) without abnormal findings: Secondary | ICD-10-CM

## 2016-06-25 LAB — URINALYSIS W MICROSCOPIC + REFLEX CULTURE
Bacteria, UA: NONE SEEN [HPF]
Bilirubin Urine: NEGATIVE
CRYSTALS: NONE SEEN [HPF]
Casts: NONE SEEN [LPF]
GLUCOSE, UA: NEGATIVE
Ketones, ur: NEGATIVE
Leukocytes, UA: NEGATIVE
Nitrite: NEGATIVE
RBC / HPF: NONE SEEN RBC/HPF (ref <=2)
Specific Gravity, Urine: 1.025 (ref 1.001–1.035)
WBC UA: NONE SEEN WBC/HPF (ref <=5)
Yeast: NONE SEEN [HPF]
pH: 6.5 (ref 5.0–8.0)

## 2016-06-25 NOTE — Progress Notes (Signed)
Gunnar Fusi not seen, problem with her insurance, will reschedule

## 2016-10-20 ENCOUNTER — Emergency Department (HOSPITAL_COMMUNITY)
Admission: EM | Admit: 2016-10-20 | Discharge: 2016-10-20 | Disposition: A | Discharge status: Home / Self Care | Payer: 59 | Attending: Emergency Medicine | Admitting: Emergency Medicine

## 2016-10-20 ENCOUNTER — Encounter (HOSPITAL_COMMUNITY): Payer: Self-pay | Admitting: Emergency Medicine

## 2016-10-20 DIAGNOSIS — R45851 Suicidal ideations: Secondary | ICD-10-CM | POA: Insufficient documentation

## 2016-10-20 DIAGNOSIS — Z79899 Other long term (current) drug therapy: Secondary | ICD-10-CM | POA: Insufficient documentation

## 2016-10-20 DIAGNOSIS — Z818 Family history of other mental and behavioral disorders: Secondary | ICD-10-CM

## 2016-10-20 DIAGNOSIS — F329 Major depressive disorder, single episode, unspecified: Secondary | ICD-10-CM | POA: Insufficient documentation

## 2016-10-20 DIAGNOSIS — F332 Major depressive disorder, recurrent severe without psychotic features: Secondary | ICD-10-CM

## 2016-10-20 HISTORY — DX: Major depressive disorder, single episode, unspecified: F32.9

## 2016-10-20 HISTORY — DX: Depression, unspecified: F32.A

## 2016-10-20 LAB — COMPREHENSIVE METABOLIC PANEL
ALT: 14 U/L (ref 14–54)
AST: 19 U/L (ref 15–41)
Albumin: 4.5 g/dL (ref 3.5–5.0)
Alkaline Phosphatase: 49 U/L (ref 38–126)
Anion gap: 10 (ref 5–15)
BUN: 10 mg/dL (ref 6–20)
CHLORIDE: 102 mmol/L (ref 101–111)
CO2: 25 mmol/L (ref 22–32)
CREATININE: 0.9 mg/dL (ref 0.44–1.00)
Calcium: 9.3 mg/dL (ref 8.9–10.3)
GFR calc non Af Amer: >60 mL/min (ref >60)
Glucose, Bld: 97 mg/dL (ref 65–99)
Potassium: 3.4 mmol/L — ABNORMAL LOW (ref 3.5–5.1)
SODIUM: 137 mmol/L (ref 135–145)
Total Bilirubin: 1.1 mg/dL (ref 0.3–1.2)
Total Protein: 7.4 g/dL (ref 6.5–8.1)

## 2016-10-20 LAB — ACETAMINOPHEN LEVEL: Acetaminophen (Tylenol), Serum: <10 ug/mL — ABNORMAL LOW (ref 10–30)

## 2016-10-20 LAB — RAPID URINE DRUG SCREEN, HOSP PERFORMED
Amphetamines: NOT DETECTED
BARBITURATES: NOT DETECTED
BENZODIAZEPINES: NOT DETECTED
Cocaine: NOT DETECTED
OPIATES: NOT DETECTED
Tetrahydrocannabinol: NOT DETECTED

## 2016-10-20 LAB — CBC
HCT: 39.2 % (ref 36.0–46.0)
HEMOGLOBIN: 13.8 g/dL (ref 12.0–15.0)
MCH: 29.2 pg (ref 26.0–34.0)
MCHC: 35.2 g/dL (ref 30.0–36.0)
MCV: 82.9 fL (ref 78.0–100.0)
PLATELETS: 277 10*3/uL (ref 150–400)
RBC: 4.73 MIL/uL (ref 3.87–5.11)
RDW: 12.2 % (ref 11.5–15.5)
WBC: 5.2 10*3/uL (ref 4.0–10.5)

## 2016-10-20 LAB — SALICYLATE LEVEL

## 2016-10-20 LAB — I-STAT BETA HCG BLOOD, ED (MC, WL, AP ONLY): I-stat hCG, quantitative: <5 m[IU]/mL (ref <5)

## 2016-10-20 LAB — ETHANOL: Alcohol, Ethyl (B): <5 mg/dL (ref <5)

## 2016-10-20 MED ORDER — FLUDROCORTISONE ACETATE 0.1 MG PO TABS: 0.2000 mg | ORAL_TABLET | Freq: Every day | ORAL | Status: DC

## 2016-10-20 MED ORDER — MONTELUKAST SODIUM 10 MG PO TABS: 10.0000 mg | ORAL_TABLET | Freq: Every day | ORAL | Status: DC

## 2016-10-20 MED ORDER — DULOXETINE HCL 60 MG PO CPEP
60.0000 mg | ORAL_CAPSULE | Freq: Every day | ORAL | Status: DC
  Administered 2016-10-20: 60 mg via ORAL
  Filled 2016-10-20 (×2): qty 1

## 2016-10-20 MED ORDER — ALBUTEROL SULFATE HFA 108 (90 BASE) MCG/ACT IN AERS: 2.0000 | INHALATION_SPRAY | RESPIRATORY_TRACT | Status: DC | PRN
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
## 2016-10-20 NOTE — Discharge Instructions (Signed)
Follow-up with outpatient resources as instructed.

## 2016-10-20 NOTE — Consult Note (Signed)
Telepsych Consultation   Reason for Consult: Depression Referring Physician: EDP Patient Identification: [name] [name] MRN:  [id number] Principal Diagnosis: <principal problem not specified> Diagnosis:   Patient Active Problem List   Diagnosis Date Noted  . Nexplanon in place [Z97.5] 05/14/2016  . Right thyroid nodule [E04.1] 01/19/2015  . POTS (postural orthostatic tachycardia syndrome) [R00.0, I95.1] 11/23/2013  . Palpitations [R00.2] 11/23/2013  . Chest pain [R07.9]   . Nausea and vomiting [R11.2] 11/02/2011  . Vertigo [R42] 11/01/2011  . Labyrinthitis [H83.09] 11/01/2011  . Seizure disorder ([place]) [Y56.389] 11/01/2011  . Asthma [J45.909] 11/01/2011    Total Time spent with patient: 30 minutes  Subjective:   [name] [name] is a 26 y.o. female patient admitted with MDD, recurrent episode, severe.  HPI: Per the assessment completed earlier today by Marisa Cyphers: [name] [name] is a 26 y.o. female, presenting to [facility] voluntarily due to depression and overwhelming feelings of anxiety. Pt reports having had passive thoughts of dying, but no active SI or plans. No HI or AVH. Pt reports coming into the ED b/c she didn't have a therapist appt for a couple of weeks and she didn't feel that she could wait that long. While in the ED, pt was able to secure an appt for 2 days away, the 23rd. Clinician verified this information with therapist Murtis Sink at [facility] [phone]). Pt also has made an appt with her PCP for tomorrow for medication adjustments. Clinician also spoke to pt's mother, [name] [name] ([phone]), who feels comfortable with pt being d/c with her current appts in place.  On my assessment today: Patient was seen via tele-psych, chart reviewed with treatment team. Patient in bed, awake, alert and oriented x4. Patient reiterated the reason for this hospital admission as documented above. Patient stated, "I came to the hospital because I was feeling overwhelmed and  I was not able to find someone to talk to". Patient stated that today, she feels much better after a restful night sleep which she haven't had in two day. Patient stated current stressor includes recent break up with her boyfriend and a recent death in the family. Patient stated that her mind much clearer now and she is ready to return home. Patient stated that she has a lot of support system including her mother, twin sister and her therapist. She stated that she was able to get an early appointment to see her OP provider tomorrow for medications management as her Cymbalta does not seen to be effective anymore. Patient also stated that she has an appointment with her therapist this Thursday. She stated that she feels safe to go home and will return to the hospital if she needs to. Patient contracts for safety.  Past Psychiatric History: See H&P  Risk to Self: Suicidal Ideation: No-Not Currently/Within Last 6 Months Suicidal Intent: No Is patient at risk for suicide?: No Suicidal Plan?: No Access to Means: No Intentional Self Injurious Behavior: None Risk to Others: Homicidal Ideation: No Thoughts of Harm to Others: No Current Homicidal Intent: No Current Homicidal Plan: No Access to Homicidal Means: No History of harm to others?: No Assessment of Violence: None Noted Does patient have access to weapons?: No Criminal Charges Pending?: No Does patient have a court date: No Prior Inpatient Therapy: Prior Inpatient Therapy: No Prior Outpatient Therapy: Prior Outpatient Therapy: No Does patient have an ACCT team?: No Does patient have Intensive In-House Services?  : No Does patient have [facility] services? : No Does patient have  P4CC services?: No  Past Medical History:  Past Medical History:  Diagnosis Date  . Chest pain   . Depression   . Pott's disease   . Seizures ([place])     Past Surgical History:  Procedure Laterality Date  . TONSILLECTOMY     Family History:  Family History   Problem Relation Age of Onset  . Thyroid disease Brother        hashimoto's thyroiditis  . Thyroid disease Sister    Family Psychiatric  History:   Social History:  History  Alcohol Use  . 0.0 oz/week    Comment: rarely     History  Drug Use No    Social History   Social History  . Marital status: Single    Spouse name: N/A  . Number of children: N/A  . Years of education: N/A   Social History Main Topics  . Smoking status: Never Smoker  . Smokeless tobacco: Never Used  . Alcohol use 0.0 oz/week     Comment: rarely  . Drug use: No  . Sexual activity: Yes    Birth control/ protection: Implant, Inserts     Comment: NEXPLANON and Nuva Ring   Other Topics Concern  . None   Social History Narrative  . None   Additional Social History:    Allergies:   Allergies  Allergen Reactions  . Penicillins     Rash   . Amoxicillin Rash    Labs:  Results for orders placed or performed during the hospital encounter of 10/20/16 (from the past 48 hour(s))  Comprehensive metabolic panel     Status: Abnormal   Collection Time: 10/20/16  3:10 AM  Result Value Ref Range   Sodium 137 135 - 145 mmol/L   Potassium 3.4 (L) 3.5 - 5.1 mmol/L   Chloride 102 101 - 111 mmol/L   CO2 25 22 - 32 mmol/L   Glucose, Bld 97 65 - 99 mg/dL   BUN 10 6 - 20 mg/dL   Creatinine, Ser 0.90 0.44 - 1.00 mg/dL   Calcium 9.3 8.9 - 10.3 mg/dL   Total Protein 7.4 6.5 - 8.1 g/dL   Albumin 4.5 3.5 - 5.0 g/dL   AST 19 15 - 41 U/L   ALT 14 14 - 54 U/L   Alkaline Phosphatase 49 38 - 126 U/L   Total Bilirubin 1.1 0.3 - 1.2 mg/dL   GFR calc non Af Amer >60 >60 mL/min   GFR calc Af Amer >60 >60 mL/min    Comment: (NOTE) The eGFR has been calculated using the CKD EPI equation. This calculation has not been validated in all clinical situations. eGFR's persistently <60 mL/min signify possible Chronic Kidney Disease.    Anion gap 10 5 - 15  Ethanol     Status: None   Collection Time: 10/20/16  3:10  AM  Result Value Ref Range   Alcohol, Ethyl (B) <5 <5 mg/dL    Comment:        LOWEST DETECTABLE LIMIT FOR SERUM ALCOHOL IS 5 mg/dL FOR MEDICAL PURPOSES ONLY   Salicylate level     Status: None   Collection Time: 10/20/16  3:10 AM  Result Value Ref Range   Salicylate Lvl <8.4 2.8 - 30.0 mg/dL  Acetaminophen level     Status: Abnormal   Collection Time: 10/20/16  3:10 AM  Result Value Ref Range   Acetaminophen (Tylenol), Serum <10 (L) 10 - 30 ug/mL    Comment:  THERAPEUTIC CONCENTRATIONS VARY SIGNIFICANTLY. A RANGE OF 10-30 ug/mL MAY BE AN EFFECTIVE CONCENTRATION FOR MANY PATIENTS. HOWEVER, SOME ARE BEST TREATED AT CONCENTRATIONS OUTSIDE THIS RANGE. ACETAMINOPHEN CONCENTRATIONS >150 ug/mL AT 4 HOURS AFTER INGESTION AND >50 ug/mL AT 12 HOURS AFTER INGESTION ARE OFTEN ASSOCIATED WITH TOXIC REACTIONS.   cbc     Status: None   Collection Time: 10/20/16  3:10 AM  Result Value Ref Range   WBC 5.2 4.0 - 10.5 K/uL   RBC 4.73 3.87 - 5.11 MIL/uL   Hemoglobin 13.8 12.0 - 15.0 g/dL   HCT 39.2 36.0 - 46.0 %   MCV 82.9 78.0 - 100.0 fL   MCH 29.2 26.0 - 34.0 pg   MCHC 35.2 30.0 - 36.0 g/dL   RDW 12.2 11.5 - 15.5 %   Platelets 277 150 - 400 K/uL  Rapid urine drug screen (hospital performed)     Status: None   Collection Time: 10/20/16  3:13 AM  Result Value Ref Range   Opiates NONE DETECTED NONE DETECTED   Cocaine NONE DETECTED NONE DETECTED   Benzodiazepines NONE DETECTED NONE DETECTED   Amphetamines NONE DETECTED NONE DETECTED   Tetrahydrocannabinol NONE DETECTED NONE DETECTED   Barbiturates NONE DETECTED NONE DETECTED    Comment:        DRUG SCREEN FOR MEDICAL PURPOSES ONLY.  IF CONFIRMATION IS NEEDED FOR ANY PURPOSE, NOTIFY LAB WITHIN 5 DAYS.        LOWEST DETECTABLE LIMITS FOR URINE DRUG SCREEN Drug Class       Cutoff (ng/mL) Amphetamine      1000 Barbiturate      200 Benzodiazepine   734 Tricyclics       193 Opiates          300 Cocaine          300 THC               50   I-Stat Beta hCG blood, ED ([facility], [facility], [facility] only)     Status: None   Collection Time: 10/20/16  7:31 AM  Result Value Ref Range   I-stat hCG, quantitative <5.0 <5 mIU/mL   Comment 3            Comment:   GEST. AGE      CONC.  (mIU/mL)   <=1 WEEK        5 - 50     2 WEEKS       50 - 500     3 WEEKS       100 - 10,000     4 WEEKS     1,000 - 30,000        FEMALE AND NON-PREGNANT FEMALE:     LESS THAN 5 mIU/mL     Current Facility-Administered Medications  Medication Dose Route Frequency Provider Last Rate Last Dose  . albuterol (PROVENTIL HFA;VENTOLIN HFA) 108 (90 Base) MCG/ACT inhaler 2 puff  2 puff Inhalation Q4H PRN Ward, Kristen N, DO      . DULoxetine (CYMBALTA) DR capsule 60 mg  60 mg Oral Daily Ward, Kristen N, DO   60 mg at 10/20/16 1153   Current Outpatient Prescriptions  Medication Sig Dispense Refill  . albuterol (PROVENTIL HFA;VENTOLIN HFA) 108 (90 BASE) MCG/ACT inhaler Inhale into the lungs every 6 (six) hours as needed for wheezing or shortness of breath.    . cetirizine (ZYRTEC) 10 MG tablet Take 10 mg by mouth daily as needed for allergies.    Marland Kitchen  DULoxetine (CYMBALTA) 60 MG capsule Take 60 mg by mouth daily.    Marland Kitchen etonogestrel (NEXPLANON) 68 MG IMPL implant 1 each by Subdermal route once.      Musculoskeletal: UTA via telepsych  Psychiatric Specialty Exam: Physical Exam  Nursing note and vitals reviewed.   Review of Systems  Psychiatric/Behavioral: Positive for depression (stable for discharge). Negative for hallucinations, memory loss, substance abuse and suicidal ideas. The patient is not nervous/anxious and does not have insomnia.   All other systems reviewed and are negative.   Blood pressure (!) 135/98, pulse 77, temperature 98 F (36.7 C), temperature source Oral, resp. rate 16, height '5\' 4"'  (1.626 m), weight 54.4 kg (120 lb), last menstrual period 10/16/2016, SpO2 100 %.Body mass index is 20.6 kg/m.  General Appearance: on hospital burgundy  scrub  Eye Contact:  Good  Speech:  Clear and Coherent and Normal Rate  Volume:  Normal  Mood:  Euthymic and pleasant  Affect:  Appropriate and cheerful  Thought Process:  Coherent and Goal Directed  Orientation:  Full (Time, Place, and Person)  Thought Content:  WDL and Logical  Suicidal Thoughts:  No  Homicidal Thoughts:  No  Memory:  Immediate;   Good Recent;   Good Remote;   Fair  Judgement:  Good  Insight:  Good and Present  Psychomotor Activity:  Normal  Concentration:  Concentration: Good and Attention Span: Good  Recall:  Good  Fund of Knowledge:  Good  Language:  Good  Akathisia:  Negative  Handed:  Right  AIMS (if indicated):     Assets:  Communication Skills Desire for Improvement Financial Resources/Insurance Housing Leisure Time Physical Health Resilience Social Support Talents/Skills  ADL's:  Intact  Cognition:  WNL  Sleep:        Treatment Plan Summary: Plan to discharge patient as patient already has OP follow up appointment tomorrow. Patient currently denies any SI/HI/VAH and contracts for safety.  Disposition: No evidence of imminent risk to self or others at present.   Patient does not meet criteria for psychiatric inpatient admission. Supportive therapy provided about ongoing stressors. Discussed crisis plan, support from social network, calling 911, coming to the Emergency Department, and calling Suicide Hotline.  Vicenta Aly, NP 10/20/2016 12:13 PM

## 2016-10-20 NOTE — ED Triage Notes (Signed)
Pt reports feeling sad and depressed, has a hx of depression but the thoughts have compiled over the past few days b/c of several stressors recently. Pt spoke with her therapist who suggested she come in. Denies SI, denies HI.

## 2016-10-20 NOTE — ED Notes (Addendum)
ALL belongings located in [place] by [place], EMT - given to pt - pt verified all belongings present.

## 2016-10-20 NOTE — ED Notes (Signed)
Staffing called for a sitter. No one at this time will cover at 67

## 2016-10-20 NOTE — BH Assessment (Addendum)
Tele Assessment Note   [name] [name] is a 26 y.o. female, presenting to [facility] voluntarily due to depression and overwhelming feelings of anxiety. Pt reports having had passive thoughts of dying, but no active SI or plans. No HI or AVH. Pt reports coming into the ED b/c she didn't have a therapist appt for a couple of weeks and she didn't feel that she could wait that long. While in the ED, pt was able to secure an appt for 2 days away, the 23rd. Clinician verified this information with therapist Jacquiline Doe at [facility] [phone]). Pt also has made an appt with her PCP for tomorrow for medication adjustments. Clinician also spoke to pt's mother, [name] ([phone]), who feels comfortable with pt being d/c with her current appts in place.   Diagnosis: MDD, recurrent episode, severe  Past Medical History:  Past Medical History:  Diagnosis Date  . Chest pain   . Depression   . Pott's disease   . Seizures (HCC)     Past Surgical History:  Procedure Laterality Date  . TONSILLECTOMY      Family History:  Family History  Problem Relation Age of Onset  . Thyroid disease Brother        hashimoto's thyroiditis  . Thyroid disease Sister     Social History:  reports that she has never smoked. She has never used smokeless tobacco. She reports that she drinks alcohol. She reports that she does not use drugs.  Additional Social History:  Alcohol / Drug Use Pain Medications: see PTA meds Prescriptions: see PTA meds Over the Counter: see PTA meds History of alcohol / drug use?: No history of alcohol / drug abuse  CIWA: CIWA-Ar BP: (!) 135/98 Pulse Rate: 77 COWS:    PATIENT STRENGTHS: (choose at least two) Average or above average intelligence Capable of independent living Communication skills Motivation for treatment/growth Supportive family/friends  Allergies:  Allergies  Allergen Reactions  . Penicillins     Rash   . Amoxicillin Rash    Home Medications:   (Not in a hospital admission)  OB/GYN Status:  Patient's last menstrual period was 10/16/2016 (exact date).  General Assessment Data Location of Assessment: [facility] ED TTS Assessment: In system Is this a Tele or Face-to-Face Assessment?: Tele Assessment Is this an Initial Assessment or a Re-assessment for this encounter?: Initial Assessment Marital status: Single Is patient pregnant?: No Pregnancy Status: No Living Arrangements: Alone Can pt return to current living arrangement?: Yes Admission Status: Voluntary Is patient capable of signing voluntary admission?: Yes Referral Source: Self/Family/Friend Insurance type: BCBS     Crisis Care Plan Living Arrangements: Alone Name of Psychiatrist: none Name of Therapist: [facility]  Education Status Is patient currently in school?: No  Risk to self with the past 6 months Suicidal Ideation: No-Not Currently/Within Last 6 Months Has patient been a risk to self within the past 6 months prior to admission? : No Suicidal Intent: No Has patient had any suicidal intent within the past 6 months prior to admission? : No Is patient at risk for suicide?: No Suicidal Plan?: No Has patient had any suicidal plan within the past 6 months prior to admission? : No Access to Means: No Previous Attempts/Gestures: No Intentional Self Injurious Behavior: None Family Suicide History: No Persecutory voices/beliefs?: No Depression: Yes Depression Symptoms: Tearfulness, Insomnia Substance abuse history and/or treatment for substance abuse?: No Suicide prevention information given to non-admitted patients: Yes  Risk to Others within the past 6 months  Homicidal Ideation: No Does patient have any lifetime risk of violence toward others beyond the six months prior to admission? : No Thoughts of Harm to Others: No Current Homicidal Intent: No Current Homicidal Plan: No Access to Homicidal Means: No History of harm to others?: No Assessment of  Violence: None Noted Does patient have access to weapons?: No Criminal Charges Pending?: No Does patient have a court date: No Is patient on probation?: No  Psychosis Hallucinations: None noted Delusions: None noted  Mental Status Report Appearance/Hygiene: Unremarkable Eye Contact: Good Motor Activity: Unremarkable Speech: Unremarkable Level of Consciousness: Alert Mood: Pleasant, Euthymic Affect: Appropriate to circumstance Anxiety Level: Minimal Thought Processes: Coherent, Relevant Judgement: Unimpaired Orientation: Person, Place, Time, Situation Obsessive Compulsive Thoughts/Behaviors: None  Cognitive Functioning Concentration: Normal Memory: Recent Intact, Remote Intact IQ: Average Insight: Good Impulse Control: Good Appetite: Poor Sleep: Decreased Total Hours of Sleep: 3 Vegetative Symptoms: None  ADLScreening [facility] Assessment Services) Patient's cognitive ability adequate to safely complete daily activities?: Yes Patient able to express need for assistance with ADLs?: Yes Independently performs ADLs?: Yes (appropriate for developmental age)  Prior Inpatient Therapy Prior Inpatient Therapy: No  Prior Outpatient Therapy Prior Outpatient Therapy: No Does patient have an ACCT team?: No Does patient have Intensive In-House Services?  : No Does patient have [facility] services? : No Does patient have P4CC services?: No  ADL Screening (condition at time of admission) Patient's cognitive ability adequate to safely complete daily activities?: Yes Is the patient deaf or have difficulty hearing?: No Does the patient have difficulty seeing, even when wearing glasses/contacts?: No Does the patient have difficulty concentrating, remembering, or making decisions?: No Patient able to express need for assistance with ADLs?: Yes Does the patient have difficulty dressing or bathing?: No Independently performs ADLs?: Yes (appropriate for developmental age) Does the patient  have difficulty walking or climbing stairs?: No Weakness of Legs: None Weakness of Arms/Hands: None  Home Assistive Devices/Equipment Home Assistive Devices/Equipment: None    Abuse/Neglect Assessment (Assessment to be complete while patient is alone) Physical Abuse: Denies Verbal Abuse: Denies Sexual Abuse: Denies Exploitation of patient/patient's resources: Denies Self-Neglect: Denies Values / Beliefs Cultural Requests During Hospitalization: None Spiritual Requests During Hospitalization: None   Advance Directives (For Healthcare) Does Patient Have a Medical Advance Directive?: No Would patient like information on creating a medical advance directive?: No - Patient declined Nutrition Screen- [facility] Adult/WL/AP Patient's home diet: Regular Has the patient recently lost weight without trying?: No Has the patient been eating poorly because of a decreased appetite?: Yes Malnutrition Screening Tool Score: 1  Additional Information 1:1 In Past 12 Months?: No CIRT Risk: No Elopement Risk: No Does patient have medical clearance?: Yes     Disposition:  Disposition Initial Assessment Completed for this Encounter: Yes (consulted with Ferne Reus, NP) Disposition of Patient: Outpatient treatment Type of outpatient treatment: Adult (Pt has appts set up. )  Laddie Aquas 10/20/2016 12:29 PM

## 2016-10-20 NOTE — ED Notes (Signed)
Psych NP speaking to patient via tele psych

## 2016-10-20 NOTE — Progress Notes (Signed)
Per Ferne Reus, NP, patient does not meet criteria for inpatient treatment. Patient is recommended for discharge.   Per assessment note, patient has an upcoming appointment with her therapist Jacquiline Doe at [facility] [phone]). Pt also has made an appt with her PCP for tomorrow for medication adjustments.    Jerrol Banana, RN notified and has agreed to notify patient's nurse, Sharyon Cable, RN.   Baldo Daub MSW, LCSWA CSW Disposition [phone]

## 2016-10-21 DIAGNOSIS — F339 Major depressive disorder, recurrent, unspecified: Secondary | ICD-10-CM | POA: Diagnosis not present

## 2016-10-22 ENCOUNTER — Telehealth (HOSPITAL_COMMUNITY): Payer: Self-pay | Admitting: Professional

## 2016-10-23 ENCOUNTER — Other Ambulatory Visit (HOSPITAL_COMMUNITY): Payer: 59 | Attending: Psychiatry | Admitting: Licensed Clinical Social Worker

## 2016-10-23 DIAGNOSIS — R569 Unspecified convulsions: Secondary | ICD-10-CM | POA: Insufficient documentation

## 2016-10-23 DIAGNOSIS — F332 Major depressive disorder, recurrent severe without psychotic features: Secondary | ICD-10-CM | POA: Insufficient documentation

## 2016-10-23 DIAGNOSIS — A1801 Tuberculosis of spine: Secondary | ICD-10-CM | POA: Insufficient documentation

## 2016-10-23 NOTE — Psych (Signed)
Comprehensive Clinical Assessment (CCA) Note  10/23/2016 ASMI FUGERE [id number]  Visit Diagnosis:      ICD-10-CM   1. Severe episode of recurrent major depressive disorder, without psychotic features (HCC) F33.2       CCA Part One  Part One has been completed on paper by the patient.  (See scanned document in Chart Review)  CCA Part Two A  Intake/Chief Complaint:  CCA Intake With Chief Complaint CCA Part Two Date: 10/23/16 CCA Part Two Time: 1000 Chief Complaint/Presenting Problem: Pt reports going to the ED early Tuesday morning (8/21) due to SI.  Pt states she was starting to make plans of how she could kill herself and that scared her.  She was not admitted to inpatient, but provided with a counseling and PCP appt.  Pt reports she had not slept for about 5 days prior to the ED visit.  Pt has had numerous life changes recently (new job, boyfriend of 2 years moved out suddenly, loss of niece).  Pt reports she has experienced depression before, but "nothing this bad."  Pt states it is hard to find the energy to do anything other than lay in bed, and she finds herself cancelling plans often.  Pt reports sleep has improved the past two nights because PCP gave her hydroxyzine. Pt reports having crying spells recently "and I am not a crier."   Pt reports having mood swings ("I'd be high on life and telling myself 'I can do this' one minute and crying and being super depressed the next.") while not sleeping.  Pt reports she has not experienced frequent swings like this before.   Pt states since reaching out for help, the SI has decreased but is still present at times. Patients Currently Reported Symptoms/Problems: SI, depression, anxiety, mood swings, decrease in appetite, insomnia, racing thoughts, loss of interest, lack of motivation/energy, poor concentration, Individual's Strengths: Motivation for treatment; Supportive family and friends Type of Services Patient Feels Are Needed: Pt wants  medication management and coping skills Initial Clinical Notes/Concerns: Pt is motivated for treatment, but has concerns about losing her job due to time out of work.  Pt reports being on Duloxitine for years, but feels this medicine is not working for her as well as it once did.  Mental Health Symptoms Depression:  Depression: Change in energy/activity, Difficulty Concentrating, Hopelessness, Increase/decrease in appetite, Tearfulness, Sleep (too much or little), Weight gain/loss  Mania:     Anxiety:      Psychosis:     Trauma:     Obsessions:     Compulsions:     Inattention:     Hyperactivity/Impulsivity:     Oppositional/Defiant Behaviors:     Borderline Personality:     Other Mood/Personality Symptoms:      Mental Status Exam Appearance and self-care  Stature:  Stature: Average  Weight:  Weight: Thin  Clothing:  Clothing: Casual  Grooming:  Grooming: Normal  Cosmetic use:  Cosmetic Use: None  Posture/gait:  Posture/Gait: Normal  Motor activity:  Motor Activity: Not Remarkable  Sensorium  Attention:  Attention: Normal  Concentration:  Concentration: Normal (Pt did start to "ramble" at times, and would apologize afterwards.)  Orientation:  Orientation: X5  Recall/memory:  Recall/Memory: Normal  Affect and Mood  Affect:  Affect: Flat, Depressed  Mood:  Mood: Depressed, Anxious  Relating  Eye contact:  Eye Contact: Fleeting  Facial expression:  Facial Expression: Depressed  Attitude toward examiner:  Attitude Toward Examiner: Cooperative  Thought and Language  Speech flow: Speech Flow: Normal  Thought content:  Thought Content: Appropriate to mood and circumstances  Preoccupation:     Hallucinations:     Organization:     Company secretary of Knowledge:  Fund of Knowledge: Average  Intelligence:  Intelligence: Average  Abstraction:     Judgement:  Judgement: Fair  Dance movement psychotherapist:     Insight:  Insight: Fair  Decision Making:     Social Functioning   Social Maturity:     Social Judgement:     Stress  Stressors:  Stressors: Grief/losses, Transitions  Coping Ability:  Coping Ability: Building surveyor Deficits:     Supports:      Family and Psychosocial History: Family history Marital status: Single Does patient have children?: No  Childhood History:  Childhood History By whom was/is the patient raised?: Psychologist, occupational and step-parent Additional childhood history information: Pt states her parents divorced when she was in 7th grade.  Pt reports mainly living with mother.  Pt states she was close to her step-father at first, but now dislikes him. Description of patient's relationship with caregiver when they were a child: Pt was close to both parents when growing up Patient's description of current relationship with people who raised him/her: Pt is still close with both parents; Pt reports both parents are supportive; Pt states she dislikes her step-dad because "he is manipulative". How were you disciplined when you got in trouble as a child/adolescent?: grounding and spanking Does patient have siblings?: Yes Number of Siblings: 5 Description of patient's current relationship with siblings: close with all siblings except youngest brother ("He is materialistic and I am not.") Did patient suffer any verbal/emotional/physical/sexual abuse as a child?: No Did patient suffer from severe childhood neglect?: No Has patient ever been sexually abused/assaulted/raped as an adolescent or adult?: No Was the patient ever a victim of a crime or a disaster?: No Witnessed domestic violence?: No Has patient been effected by domestic violence as an adult?: No  CCA Part Two B  Employment/Work Situation: Employment / Work Psychologist, occupational Employment situation: Employed Where is patient currently employed?: [employer] How long has patient been employed?: 1 month Patient's job has been impacted by current illness: Yes Describe how patient's job has  been impacted: Pt was pulled out of work by PCP until psych eval can be done by psychiatrist Has patient ever been in the [employer]?: No Has patient ever served in combat?: No Did You Receive Any Psychiatric Treatment/Services While in the [employer]?: No Are There Guns or Other Weapons in Your Home?: Yes Are These Weapons Safely Secured?: Yes  Education: Education Did Theme park manager?: Yes What Type of College Degree Do you Have?: Business Did You Have An Individualized Education Program (IIEP): No Did You Have Any Difficulty At [employer]?: No  Religion: Religion/Spirituality Are You A Religious Person?: Yes What is Your Religious Affiliation?: Chiropodist: Leisure / Recreation Leisure and Hobbies: horseback riding; going out with friends; listening to live bands  Exercise/Diet: Exercise/Diet Do You Exercise?: Yes What Type of Exercise Do You Do?: Run/Walk How Many Times a Week Do You Exercise?: Daily Have You Gained or Lost A Significant Amount of Weight in the Past Six Months?: Yes-Lost Number of Pounds Lost?: 5 (Pt reports she has lost 5lbs in 1 week) Do You Follow a Special Diet?: No Do You Have Any Trouble Sleeping?: Yes Explanation of Sleeping Difficulties: Pt reports she is struggling with insomnia.  Pt states she has slept the previous  2 evenings due to medication.  CCA Part Two C  Alcohol/Drug Use: Alcohol / Drug Use Pain Medications: see MAR Prescriptions: Duloxitine 60mg ; Hydroxyzine 25mg  - See MAR Over the Counter: see MAR History of alcohol / drug use?: No history of alcohol / drug abuse    CCA Part Three  ASAM's:  Six Dimensions of Multidimensional Assessment  Dimension 1:  Acute Intoxication and/or Withdrawal Potential:     Dimension 2:  Biomedical Conditions and Complications:     Dimension 3:  Emotional, Behavioral, or Cognitive Conditions and Complications:     Dimension 4:  Readiness to Change:     Dimension 5:  Relapse, Continued  use, or Continued Problem Potential:     Dimension 6:  Recovery/Living Environment:      Substance use Disorder (SUD)    Social Function:     Stress:  Stress Stressors: Grief/losses, Transitions Coping Ability: Overwhelmed Patient Takes Medications The Way The Doctor Instructed?: Yes Priority Risk: High Risk  Risk Assessment- Self-Harm Potential: Risk Assessment For Self-Harm Potential Thoughts of Self-Harm: Vague current thoughts Method: No plan Availability of Means: No access/NA Additional Information for Self-Harm Potential: Preoccupation with Death Additional Comments for Self-Harm Potential: Pt reports an increase in SI recently.  Pt states she started thinking about different, specific ways she could "not exist anymore"  Risk Assessment -Dangerous to Others Potential: Risk Assessment For Dangerous to Others Potential Method: No Plan Availability of Means: No access or NA  DSM5 Diagnoses: Patient Active Problem List   Diagnosis Date Noted  . Nexplanon in place 05/14/2016  . Right thyroid nodule 01/19/2015  . POTS (postural orthostatic tachycardia syndrome) 11/23/2013  . Palpitations 11/23/2013  . Chest pain   . Nausea and vomiting 11/02/2011  . Vertigo 11/01/2011  . Labyrinthitis 11/01/2011  . Seizure disorder (HCC) 11/01/2011  . Asthma 11/01/2011    Patient Centered Plan: Patient is on the following Treatment Plan(s):  Depression  Recommendations for Services/Supports/Treatments: Recommendations for Services/Supports/Treatments Recommendations For Services/Supports/Treatments: Partial Hospitalization (Pt is in need of medication management, increase coping skills/distress tolerance.  Pt has an ind counselor (1st apt 8/23), but feels more intensive treatment is needed.  Pt reports a decrease in SI since reaching out for help but still has passive SI)  Treatment Plan Summary: OP Treatment Plan Summary: "This is worse than the depression before and I don't want  to feel like this."  Referrals to Alternative Service(s): Referred to Alternative Service(s):   Place:   Date:   Time:    Referred to Alternative Service(s):   Place:   Date:   Time:    Referred to Alternative Service(s):   Place:   Date:   Time:    Referred to Alternative Service(s):   Place:   Date:   Time:     Quinn Axe, LPCA

## 2016-10-26 ENCOUNTER — Other Ambulatory Visit (HOSPITAL_COMMUNITY): Payer: 59 | Admitting: Licensed Clinical Social Worker

## 2016-10-26 DIAGNOSIS — R569 Unspecified convulsions: Secondary | ICD-10-CM | POA: Diagnosis not present

## 2016-10-26 DIAGNOSIS — A1801 Tuberculosis of spine: Secondary | ICD-10-CM | POA: Diagnosis not present

## 2016-10-26 DIAGNOSIS — F332 Major depressive disorder, recurrent severe without psychotic features: Secondary | ICD-10-CM | POA: Diagnosis not present

## 2016-10-27 ENCOUNTER — Other Ambulatory Visit (HOSPITAL_COMMUNITY): Payer: 59 | Admitting: Licensed Clinical Social Worker

## 2016-10-27 ENCOUNTER — Encounter (HOSPITAL_COMMUNITY): Payer: Self-pay | Admitting: Psychiatry

## 2016-10-27 ENCOUNTER — Other Ambulatory Visit (HOSPITAL_COMMUNITY): Payer: 59 | Admitting: Occupational Therapy

## 2016-10-27 DIAGNOSIS — A1801 Tuberculosis of spine: Secondary | ICD-10-CM | POA: Diagnosis not present

## 2016-10-27 DIAGNOSIS — F332 Major depressive disorder, recurrent severe without psychotic features: Secondary | ICD-10-CM | POA: Insufficient documentation

## 2016-10-27 DIAGNOSIS — R4589 Other symptoms and signs involving emotional state: Secondary | ICD-10-CM

## 2016-10-27 DIAGNOSIS — R569 Unspecified convulsions: Secondary | ICD-10-CM | POA: Diagnosis not present

## 2016-10-27 MED ORDER — BUSPIRONE HCL 10 MG PO TABS: 10.0000 mg | ORAL_TABLET | Freq: Three times a day (TID) | ORAL | 1 refills | Status: DC

## 2016-10-27 NOTE — Psych (Signed)
Behavioral Health Partial Program Assessment Note  Date: 10/27/2016 Name: [name] [name] MRN: [id number]  Chief Complaint: depression  Subjective:less depressed than when she went to the ED but still depressed with some passive suicidal thoughts without plan or intent   HPI:[name] [name] has had anxiety and depression for years.  She has gotten more depressed over the last few months even before the stressors that threw her over the edge causing her to seek help.  She has a twin sister whose baby died in utero just before the due date due to the cord wrapped around its neck.  Then 2 weeks ago her boyfriend of two years whom she was thinking she would probably marry up and left saying he was not cut out for relationships.  That was devastating to her and led to deeper depression and suicidal thoughts with a plan.  She got help in the ED and was referred here.  She was depressed about 7 or 8 years ago and got better with therapy.  She has been on duloxetine for 2 years which initially helped but seems no help currently.  Hydroxyzine was prescribed during this episode and that helps with anxiety and sleep but makes her too sleepy.  She is still depressed with little interest in activities, no energy, trouble getting and staying asleep, poor appetite, feeling she would be better off not being in the worls at times but no intent to harm herself.  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 Patient is a 26 y.o. Caucasian female presents with depression.  Patient was enrolled in partial psychiatric program on 10/27/16.  Primary complaints include: anxiety, depression worse, feeling depressed, poor concentration, problem with medication and relationship difficulties.  Onset of symptoms was gradual with gradually worsening course since that time. Psychosocial Stressors include the following: boyfriend.   I have reviewed the record of intervention in the ED and initial assessment here as well as the history as presented by  the patient  Complaints of Pain: nonear Past Psychiatric History:  Medication and therapy 7 or 8 years ago  Currently in treatment with her PCP.  Substance Abuse History: none Use of Alcohol: denied Use of Caffeine: other not an issue Use of over the counter: not an issue  Past Surgical History:  Procedure Laterality Date  . TONSILLECTOMY      Past Medical History:  Diagnosis Date  . Chest pain   . Depression   . Pott's disease   . Seizures [facility])    Outpatient Encounter Prescriptions as of 10/27/2016  Medication Sig  . albuterol (PROVENTIL HFA;VENTOLIN HFA) 108 (90 BASE) MCG/ACT inhaler Inhale into the lungs every 6 (six) hours as needed for wheezing or shortness of breath.  . busPIRone (BUSPAR) 10 MG tablet Take 1 tablet (10 mg total) by mouth 3 (three) times daily.  . cetirizine (ZYRTEC) 10 MG tablet Take 10 mg by mouth daily as needed for allergies.  . DULoxetine (CYMBALTA) 60 MG capsule Take 60 mg by mouth daily.  Marland Kitchen etonogestrel (NEXPLANON) 68 MG IMPL implant 1 each by Subdermal route once.   No facility-administered encounter medications on file as of 10/27/2016.    Allergies  Allergen Reactions  . Penicillins     Rash   . Amoxicillin Rash    Social History  Substance Use Topics  . Smoking status: Never Smoker  . Smokeless tobacco: Never Used  . Alcohol use 0.0 oz/week     Comment: rarely   Functioning Relationships: good support system  Education: College       Please specify degree: graduated Other Pertinent History: None Family History  Problem Relation Age of Onset  . Thyroid disease Brother        hashimoto's thyroiditis  . Thyroid disease Sister      Review of Systems Constitutional: negative Eyes: negative Ears, nose, mouth, throat, and face: negative Respiratory: negative Cardiovascular: negative Gastrointestinal: negative Genitourinary: negative Integument/breast: negative Hematologic/lymphatic: negative Musculoskeletal:  negative Neurological: negative Behavioral/Psych: depression and anxiety Endocrine: negative Allergic/Immunologic: negative  Objective:  There were no vitals filed for this visit.  Physical Exam: No exam performed today, no exam necessary.  Mental Status Exam: Appearance:  Well groomed Psychomotor::  Within Normal Limits Attention span and concentration: Normal Behavior: calm, cooperative and adequate rapport can be established Speech:  normal pitch and normal volume Mood:  depressed and anxious Affect:  normal and mood-congruent Thought Process:  Coherent and Goal Directed Thought Content:  Logical Orientation:  person, place and time/date Cognition:  grossly intact Insight:  Intact Judgment:  Intact Estimate of Intelligence: Above average Fund of knowledge: Intact Memory: Recent and remote intact Abnormal movements: None Gait and station: Normal  Assessment:  Diagnosis: No primary diagnosis found. 1. Severe recurrent major depression without psychotic features [facility])     Indications for admission: inpatient care required if not in partial hospital program  Plan: patient enrolled in Partial Hospitalization Program  Treatment options and alternatives reviewed with patient and patient understands the above plan.   Comments: close to her twin sister over the years, meeting people can be hard for her.  Wants to get better .    Carolanne Grumbling, MD

## 2016-10-27 NOTE — Therapy (Signed)
[facility] PARTIAL HOSPITALIZATION PROGRAM [address] [place], [place], [address] Phone: [phone]   Fax:  [phone]  Occupational Therapy Evaluation and Treatment  Patient Details  Name: [name] [name] MRN: [id number] Date of Birth: 1990/11/27 Referring Provider: Dr. Carolanne Grumbling  Encounter Date: 10/27/2016      OT End of Session - 10/27/16 1917    Visit Number 1   Number of Visits 6   Date for OT Re-Evaluation 11/13/16   Authorization Type UMR   OT Start Time 1030   OT Stop Time 1130   OT Time Calculation (min) 60 min   Activity Tolerance Patient tolerated treatment well   Behavior During Therapy [facility] for tasks assessed/performed      Past Medical History:  Diagnosis Date  . Chest pain   . Depression   . Pott's disease   . Seizures (HCC)     Past Surgical History:  Procedure Laterality Date  . TONSILLECTOMY      There were no vitals filed for this visit.      Subjective Assessment - 10/27/16 1902    Currently in Pain? No/denies           [facility] OT Assessment - 10/27/16 1902      Assessment   Diagnosis Major depressive disorder   Referring Provider Dr. Carolanne Grumbling   Onset Date --  chronic     Precautions   Precautions None     Balance Screen   Has the patient fallen in the past 6 months No   Has the patient had a decrease in activity level because of a fear of falling?  No   Is the patient reluctant to leave their home because of a fear of falling?  No       OT assessment Diagnosis: Major Depressive Disorder Past medical history: n/a  Living situation: alone ADLs: independent Work: [employer] Leisure: horseback riding, listening to live music, going out with friends Social support: Mom, Dad, close to 4 of 5 siblings Struggles: coping strategies; grief/losses OT goal: improve coping strategies.  General Causality Orientation Scale    Subscore Percentile Score  Autonomy 63 80.74   Control 33 5.10  Impersonal 32 8.18    Motivation Type  Motivation type Explanation   Autonomy-oriented The individual is clear about what he or she is doing.  There is clear connection between behavior and interest/personal goals.  Motivation is intact.  Assessment:  Patient demonstrates autonomy oriented motivation type.  Patient will benefit from occupational therapy intervention in order to improve time management, financial management, stress management, job readiness skills, social skills, sleep hygiene, exercise and healthy eating habits,  and health management skills and other psychosocial skills needed for preparation to return to full time community living and to be a productive community member.   Plan:  Patient will participate in skilled occupational therapy sessions individually or in a group setting to improve coping skills, psychosocial skills, and emotional skills required to return to prior level of function as a productive community member. Treatment will be 1-2 times per week for 2-6 weeks.      OT Treatment Session: Job Readiness  S: "I get frustrated when my supplies are borrowed and not returned."   O: Patient actively participated in the following skilled occupational therapy treatment session this date.  -Job Readiness: Pt educated on soft skills vs hard skills and importance of each to the job  environment. Pt participated in Dennis game  answering questions about current job, Engineer, technical sales, and working Occupational hygienist. Pt actively participated in discussion of causes   of stress at work and strategies to promote a healthy working environment. Also   discussed interpersonal skills required for the job and how to effectively communicate   with coworker and supervisors.   A: Patient participated in skilled occupational therapy group for job readiness skills this date.  Patient was engaged in group discussion and open to strategies introduced.  P: Continue  participation in skilled occupational therapy groups  1-2 times per week for 2 weeks in order to gain the necessary skills needed to return to full time community living and learn effective coping strategies to be a productive community resident. Next session: social and communication skills.                  OT Short Term Goals - 10/27/16 1918      OT SHORT TERM GOAL #1   Title Patient will be educated on strategies to improve psychosocial skills needed to participate fully in all daily, work, and leisure activities.   Time 3   Period Weeks   Status New   Target Date 11/13/16     OT SHORT TERM GOAL #2   Title Patient will be educated on a HEP and independent with implementation of HEP.   Time 3   Period Weeks   Status New     OT SHORT TERM GOAL #3   Title Patient will independently apply psychosocial skills and coping mechanisms to her daily activities in order to function independently.   Time 3   Period Weeks   Status New                  Plan - 10/27/16 1917    Occupational performance deficits (Please refer to evaluation for details): ADL's;IADL's;Rest and Sleep;Work;Leisure;Social Participation   Rehab Potential Good   OT Frequency 2x / week   OT Duration --  3 weeks   OT Treatment/Interventions Self-care/ADL training;Patient/family education  community reintegration, coping skills training, psychosocial skills training   Clinical Decision Making Limited treatment options, no task modification necessary   Consulted and Agree with Plan of Care Patient      Patient will benefit from skilled therapeutic intervention in order to improve the following deficits and impairments:   (decreased coping skills, decreased psychosocial skills)  Visit Diagnosis: Severe episode of recurrent major depressive disorder, without psychotic features (HCC)  Difficulty coping    Problem List Patient Active Problem List   Diagnosis Date Noted  . Severe recurrent  major depression without psychotic features (HCC) 10/27/2016    Class: Chronic  . Nexplanon in place 05/14/2016  . Right thyroid nodule 01/19/2015  . POTS (postural orthostatic tachycardia syndrome) 11/23/2013  . Palpitations 11/23/2013  . Chest pain   . Nausea and vomiting 11/02/2011  . Vertigo 11/01/2011  . Labyrinthitis 11/01/2011  . Seizure disorder (HCC) 11/01/2011  . Asthma 11/01/2011   Ezra Sites, OTR/L  [phone] 10/27/2016, 7:20 PM  [facility] PARTIAL HOSPITALIZATION PROGRAM [address] [place], [place], [address] Phone: [phone]   Fax:  [phone]  Name: [name] [name] MRN: [id number] Date of Birth: 1990/10/06

## 2016-10-28 ENCOUNTER — Other Ambulatory Visit (HOSPITAL_COMMUNITY): Payer: 59 | Admitting: Licensed Clinical Social Worker

## 2016-10-28 DIAGNOSIS — R569 Unspecified convulsions: Secondary | ICD-10-CM | POA: Diagnosis not present

## 2016-10-28 DIAGNOSIS — F332 Major depressive disorder, recurrent severe without psychotic features: Secondary | ICD-10-CM

## 2016-10-28 DIAGNOSIS — A1801 Tuberculosis of spine: Secondary | ICD-10-CM | POA: Diagnosis not present

## 2016-10-28 NOTE — Psych (Signed)
   [facility]  [name] [id number]  Session Time: 9 -2  Participation Level: Active  Behavioral Response: CasualAlertDepressed  Type of Therapy: Group Therapy; Psychotherapy, Spiritual Care, Activity Therapy  Treatment Goals addressed: Coping  Interventions: CBT, DBT, Supportive and Reframing  Summary:  9:00 - 10:30 Clinician led check-in regarding current stressors and situation, and review of patient completed daily inventory. Clinician utilized active listening and empathetic response and validated patient emotions. Clinician facilitated processing group on pertinent issues.  10:30 -12:00 Spiritual care group  12:00 - 12:45 Reflection group: Patients encouraged to practice skills and interpersonal techniques or work on mindfulness and relaxation techniques. The importance of self-care and making skills part of a routine to increase usage were stressed.  12:45 - 1:50 Relaxation group: Cln Forde RadonLeanne Yates led yoga group focused on retraining the body's response to stress.  1:50 - 2:00 Clinician led check-out. Clinician assessed for immediate needs, medication compliance and efficacy, and safety concerns.   Suicidal/Homicidal: Nowithout intent/plan  Therapist Response: [name] is a 26 y.o. female who presents with depression symptoms. Pt reports she is feeling "better than yesterday" and rates her current mood at a 7.5 on a 1-10 scale with 10 being great. Pt states has not experienced SI and staying with her mom helped that.  Pt reports having a "long battle" with the [employer], and it finally came to a close yesterday which relieved a lot of anxiety.  Pt states she was able to use distraction to keep SI thoughts at bay.  Pt reports she feels like she can't get her body and brain to settle down, and is really looking forward to the relaxation group. Patient engaged in activity and discussion. Patient demonstrates some progress as evidenced by increase use of  skills outside of group. Patient denies SI/HI/self-harm thoughts at end of session.  Plan: Pt will work towards decreasing depression and SI thoughts and increase ability to self manage emotions and distress tolerance.   Diagnosis: Severe recurrent major depression without psychotic features (HCC) [F33.2]    1. Severe recurrent major depression without psychotic features (HCC)     Silverio Hagan J Yohance Hathorne, LPCA 10/28/2016

## 2016-10-28 NOTE — Progress Notes (Signed)
10/28/2016 10:45-12:00 Pt attended spirituality group facilitated by chaplain Dewanda Fennema  Group focused on topic of community.  Participants engaged with facilitator and group members in a facilitated discussion of topic of community, focusing on where they experience or do not experience community in their lives, values that they look for in supportive communities.   

## 2016-10-28 NOTE — Psych (Signed)
   [facility]  [name] Soraulene A Stenseth [id number]  Session Time: 9 -2  Participation Level: Active  Behavioral Response: CasualAlertDepressed  Type of Therapy: Group Therapy; Psychotherapy, Psychoeducation; Occupational therapy  Treatment Goals addressed: Coping  Interventions: CBT, DBT, Supportive and Reframing  Summary:  9:00 - 10:30 Clinician led check-in regarding current stressors and situation, and review of patient completed daily inventory. Clinician utilized active listening and empathetic response and validated patient emotions. Clinician facilitated processing group on pertinent issues.  10:30 - 11:30: OT  11:30-12:15: Cln led psychoeducation group on sleep hygiene  12:15 -1:00: Reflection group: Patients encouraged to practice skills and interpersonal techniques or work on mindfulness and relaxation techniques. The importance of self-care and making skills part of a routine to increase usage were stressed.  1:00 - 1:50: Clinician introduced topic of "Boundaries". Group discussed the different types of boundaries (porous, rigid, healthy) and different environments where boundaries may differ.  1:50 - 2:00 Clinician led check-out. Clinician assessed for immediate needs, medication compliance and efficacy, and safety concerns.    Suicidal/Homicidal: Yeswithout intent/plan  Therapist Response: [name] [name] is a 26 y.o. female who presents with depression symptoms. Pt reports she is feeling "down" and rates her current mood at a 2.5 on a 1-10 scale with 10 being great. Pt reports having some SI. Pt reports she read and wrote in her journal to distract herself. Cln and pt safety planned for tonight. Pt reports she will stay with her mom at her mother's house tonight so she is not home alone. Pt reports she did not sleep well last night due to racing thoughts about tasks that need to be done. Cln discussed sleep hygiene with pt. Cln reminded pt it is important to get  sleep, but it is counter-productive to lay in bed for hours. Pt reports she experienced a lot of "crying spells" the evening before, and "I'm not a crier." Pt states she has an opportunity to go camping and horseback riding this weekend, which she used to love, but is "iffy" about now. Pt reports that at this moment, she plans on going to try to help increase mood. Pt states she went to the dog shelter yesterday afternoon, which helped her mood some. She did not find a dog that she wanted to adopt so she will keep looking. Patient engaged in activity and discussion. Pt identifies she tends to have porous boundaries. Patient demonstrates some progress as evidenced by engaging openly in her first session. Patient denies SI/HI/self-harm thoughts at end of session.  Plan: Pt will work towards decreasing depression and SI thoughts and increase ability to self manage emotions and distress tolerance.   Diagnosis: No primary diagnosis found.    1. Severe recurrent major depression without psychotic features (HCC)       Donia GuilesJenny Mylon Mabey, LCSW 10/28/2016

## 2016-10-28 NOTE — Psych (Signed)
   [facility]  [name] [name] [id number]  Session Time: 9 -2  Participation Level: Active  Behavioral Response: CasualAlertDepressed  Type of Therapy: Group Therapy; Psychotherapy, Psychoeducation  Treatment Goals addressed: Coping  Interventions: CBT, DBT, Supportive and Reframing  Summary:  9:00 - 10:30 Pharmacist discussed medication with group and answered questions.  10:30 -11:30: Clinician led check-in regarding current stressors and situation, and review of patient completed daily inventory. Clinician utilized active listening and empathetic response and validated patient emotions. Clinician facilitated processing group on pertinent issues.  11:30 - 12:15 Clinician led psychotherapy group on self esteem and patients considered their strengths and positive qualities.  12:15 -1:00: Reflection group: Patients encouraged to practice skills and interpersonal techniques or work on mindfulness and relaxation techniques. The importance of self-care and making skills part of a routine to increase usage were stressed.  1:00 - 1:50 Clinician introduced topic of assertiveness. Cln educated group on ways to increase assertiveness as a way to bolster self esteem.  1:50 - 2:00 Clinician led check-out. Clinician assessed for immediate needs, medication compliance and efficacy, and safety concerns.      Suicidal/Homicidal: Nowithout intent/plan  Therapist Response: [name] [name] is a 26 y.o. female who presents with depression symptoms. Pt reports she is feeling "kind of numb" and rates her current mood at a 5 on a 1-10 scale with 10 being great. Pt states she feels she has "hit a brick wall" after having a lot of emotions and now feels "just here." Pt reports having a positive weekend that kept her busy and distracted but down times continue to be rough for her. Patient engaged in activity and discussion. Pt identifies she tends toward passive communication and identifies  she avoids conflict and is a people pleaser. Pt is able to identify times in which she could utilize assertiveness. Patient demonstrates some progress as evidenced by engaging openly in her first session. Patient denies SI/HI/self-harm thoughts at end of session.  Plan: Pt will work towards decreasing depression and SI thoughts and increase ability to self manage emotions and distress tolerance.   Diagnosis: Severe episode of recurrent major depressive disorder, without psychotic features (HCC) [F33.2]    1. Severe episode of recurrent major depressive disorder, without psychotic features (HCC)       Donia GuilesJenny Ruari Mudgett, LCSW 10/28/2016

## 2016-10-29 ENCOUNTER — Other Ambulatory Visit (HOSPITAL_COMMUNITY): Payer: 59 | Admitting: Specialist

## 2016-10-29 ENCOUNTER — Other Ambulatory Visit (HOSPITAL_COMMUNITY): Payer: 59 | Admitting: Licensed Clinical Social Worker

## 2016-10-29 ENCOUNTER — Encounter (HOSPITAL_COMMUNITY): Payer: Self-pay | Admitting: Specialist

## 2016-10-29 DIAGNOSIS — R569 Unspecified convulsions: Secondary | ICD-10-CM | POA: Diagnosis not present

## 2016-10-29 DIAGNOSIS — F332 Major depressive disorder, recurrent severe without psychotic features: Secondary | ICD-10-CM

## 2016-10-29 DIAGNOSIS — R4589 Other symptoms and signs involving emotional state: Secondary | ICD-10-CM

## 2016-10-29 DIAGNOSIS — A1801 Tuberculosis of spine: Secondary | ICD-10-CM | POA: Diagnosis not present

## 2016-10-29 NOTE — Psych (Signed)
   [facility]  [name] [name] [id number]  Session Time: 9 -2  Participation Level: Active  Behavioral Response: CasualAlertDepressed  Type of Therapy: Group Therapy; Psychotherapy, Psychoeducation, Occupational therapy  Treatment Goals addressed: Coping  Interventions: CBT, DBT, Supportive and Reframing  Summary:  9:00 - 10:30 Clinician led check-in regarding current stressors and situation, and review of patient completed daily inventory. Clinician utilized active listening and empathetic response and validated patient emotions. Clinician facilitated processing group on pertinent issues.  10:30 -11:30: OT Group  11:30 - 12:00: Clinician reviewed topic of "Boundaries" from previous day. 12:00 - 12:45: Reflection group: Patients encouraged to practice skills and interpersonal techniques or work on mindfulness and relaxation techniques. The importance of self-care and making skills part of a routine to increase usage were stressed.  12:45 - 1:50  Cln introduced topic of how to set and maintain boundaries. Patients discussed specific scenarios in life where boundaries need to be addressed and ways to address them.  1:50 - 2:00 Clinician led check-out. Clinician assessed for immediate needs, medication compliance and efficacy, and safety concerns.   Suicidal/Homicidal: Nowithout intent/plan  Therapist Response: [name] Soraulene A Leyland is a 26 y.o. female who presents with depression symptoms. Pt reports she is feeling "up and down" and rates her current mood at a 4 on a 1-10 scale with 10 being great. Pt shares she feels her mood is fluctuating. Pt got a hair cut yesterday and feels it helped visually separate herself from the past few weeks. Pt states she struggled to use skills yesterday and had some success with prayer and listening to music. Pt shares worry about work and when she will need to go back without losing her job. Patient engaged in activity and discussion.  Patient demonstrates some progress as evidenced by sharing ability to recognize and use skills outside of group. Patient denies SI/HI/self-harm thoughts at end of session.  Plan: Pt will work towards decreasing depression and SI thoughts and increase ability to self manage emotions and distress tolerance.   Diagnosis: Severe recurrent major depression without psychotic features (HCC) [F33.2]    1. Severe recurrent major depression without psychotic features [facility](HCC)     Donia GuilesJenny Kiyoshi Schaab, LCSW 10/29/2016

## 2016-10-29 NOTE — Therapy (Signed)
[facility] PARTIAL HOSPITALIZATION PROGRAM [address] [place], [place], [address] Phone: [phone]   Fax:  [phone]  Occupational Therapy Treatment  Patient Details  Name: BAYYINAH DUKEMAN MRN: [id number] Date of Birth: [birth year] Referring Provider: Dr. Carolanne Grumbling  Encounter Date: 10/29/2016      OT End of Session - 10/29/16 1315    Visit Number 2   Number of Visits 6   Date for OT Re-Evaluation 11/13/16   Authorization Type UMR   OT Start Time 1035   OT Stop Time 1135   OT Time Calculation (min) 60 min   Activity Tolerance Patient tolerated treatment well   Behavior During Therapy [facility] for tasks assessed/performed      Past Medical History:  Diagnosis Date  . Chest pain   . Depression   . Pott's disease   . Seizures (HCC)     Past Surgical History:  Procedure Laterality Date  . TONSILLECTOMY      There were no vitals filed for this visit.      Subjective Assessment - 10/29/16 1315    Currently in Pain? No/denies           S:  I like to talk to people, but I have a hard time starting conversations.  [place]:  Patient participated in skilled OT group focusing on improving social and communication skills.  Patient was educated and discussed the benefits of healthy communication and social skills, as well as the detrimental effects of poor communication skills.  Group discussed self-esteem as it was identified as healthy self esteem is a fundamental building block of all other social skills.  Patient filled out "I like me from a to z" handout and discussed with group.   A:  Patient was engaged throughout session.  Patient able to reflect on 26 positive self attributes.  P:  Patient will participate in time management group.                      OT Education - 10/29/16 1315    Education provided Yes   Education Details patient educated on healthy self esteem benefits   Person(s) Educated Patient   Methods  Explanation   Comprehension Verbalized understanding          OT Short Term Goals - 10/29/16 1316      OT SHORT TERM GOAL #1   Title Patient will be educated on strategies to improve psychosocial skills needed to participate fully in all daily, work, and leisure activities.   Time 3   Period Weeks   Status On-going     OT SHORT TERM GOAL #2   Title Patient will be educated on a HEP and independent with implementation of HEP.   Time 3   Period Weeks   Status On-going     OT SHORT TERM GOAL #3   Title Patient will independently apply psychosocial skills and coping mechanisms to her daily activities in order to function independently.   Time 3   Period Weeks   Status On-going     OT SHORT TERM GOAL #4   Status On-going                Patient will benefit from skilled therapeutic intervention in order to improve the following deficits and impairments:   (decreased coping skills, decreased psychosocial skills)  Visit Diagnosis: Severe recurrent major depression without psychotic features (HCC)  Severe episode of recurrent major depressive  disorder, without psychotic features (HCC)  Difficulty coping    Problem List Patient Active Problem List   Diagnosis Date Noted  . Severe recurrent major depression without psychotic features (HCC) 10/27/2016    Class: Chronic  . Nexplanon in place 05/14/2016  . Right thyroid nodule 01/19/2015  . POTS (postural orthostatic tachycardia syndrome) 11/23/2013  . Palpitations 11/23/2013  . Chest pain   . Nausea and vomiting 11/02/2011  . Vertigo 11/01/2011  . Labyrinthitis 11/01/2011  . Seizure disorder (HCC) 11/01/2011  . Asthma 11/01/2011    Shirlean MylarBethany H. Kaelum Kissick, MHA, OTR/L [phone]  10/29/2016, 1:17 PM  [facility] PART[facility] H[facility] PROGRAM [address] [place], [place], [address] Phone: [phone]   Fax:  [phone]  Name: Francesco Soraulene A Hamblin MRN: [id number] Date of Birth:  [birth year]

## 2016-10-30 ENCOUNTER — Encounter (HOSPITAL_COMMUNITY): Payer: Self-pay

## 2016-10-30 ENCOUNTER — Other Ambulatory Visit (HOSPITAL_COMMUNITY): Payer: 59 | Admitting: Licensed Clinical Social Worker

## 2016-10-30 VITALS — BP 124/80 | HR 81 | Ht 64.5 in | Wt 125.0 lb

## 2016-10-30 DIAGNOSIS — R569 Unspecified convulsions: Secondary | ICD-10-CM | POA: Diagnosis not present

## 2016-10-30 DIAGNOSIS — F332 Major depressive disorder, recurrent severe without psychotic features: Secondary | ICD-10-CM

## 2016-10-30 DIAGNOSIS — A1801 Tuberculosis of spine: Secondary | ICD-10-CM | POA: Diagnosis not present

## 2016-10-30 NOTE — Progress Notes (Signed)
Met with patient this date as she presented with appropriate affect, level mood and denied any current auditory or visual hallucinations and no suicidal or homicidal ideations.  Patient reported she felt PHP had been helpful since she started this week after recent increased depression following the loss of her boyfriend of 2 years when he moved out and loss of her family member.  Patient reported she did not think the Cymbalta she is taking at 60 mg is as effective as it use to be and reports plans to continue to discuss this with Dr.Nateisha Moyd as he manages.  Patient denied any current problems as is staying with Mother now but does admit she will have to make a decision about returning to the home she shared with her boyfriend and that this may be difficult.  Patient reported plans to continue with PHP to help prepare for time after she finishes the program and to use coping skills to get through upcoming choices.  Patient to meet with Dr. Lovena Le in the coming week to discuss medication again but denies an urgency at this time.

## 2016-11-03 ENCOUNTER — Encounter (HOSPITAL_COMMUNITY): Payer: Self-pay | Admitting: Occupational Therapy

## 2016-11-03 ENCOUNTER — Other Ambulatory Visit (HOSPITAL_COMMUNITY): Payer: 59 | Admitting: Licensed Clinical Social Worker

## 2016-11-03 ENCOUNTER — Other Ambulatory Visit (HOSPITAL_COMMUNITY): Payer: 59 | Attending: Psychiatry | Admitting: Occupational Therapy

## 2016-11-03 DIAGNOSIS — F332 Major depressive disorder, recurrent severe without psychotic features: Secondary | ICD-10-CM | POA: Diagnosis not present

## 2016-11-03 NOTE — Psych (Signed)
  [facility] Psych Discharge Summary  [name] [name] [id number]  Admission date: 10/26/2016 Discharge date: 11/03/2016  Reason for admission: depression  Progress in Program Toward Treatment Goals: good progress.  Says she has learned a lot from the coping skills training and uses the new skills.  She was alone at her mother's house for the holiday weekend and had ups and downs but used mindfulness particularly to bring herself to a more positive frame of mind.  Still uncomfortable staying in the house she shared with her boyfriend but hopes she can be more relaxed about it in the future.  She requests discharge and says she is ready to go back to work.  Mood is much improved and she has no suicidal thinking.  Progress (rationale): motivated to change and practiced what she was taught as she went along  Discharge Plan: Referral to Psychiatrist and Referral to Counselor/Psychotherapist.  I did not change the duloxetine 60 mg as that was a big step when she was so depressed.  She has improved but still says it does not seem to be helping as it had in the past.  Buspirone is not doing anything, but anxiety is also better.    Carolanne GrumblingGerald Jerrett Baldinger, MD 11/03/2016

## 2016-11-03 NOTE — Psych (Signed)
   [facility] THERAPIST PROGRESS NOTE  [name] Soraulene A Bonser [id number]  Session Time: 9 -1  Participation Level: Active  Behavioral Response: CasualAlertEuthymic  Type of Therapy: Group Therapy; Psychotherapy, Psychoeducation, Activity therapy  Treatment Goals addressed: Coping  Interventions: CBT, DBT, Supportive and Reframing  Summary:  9:00 - 10:00 Clinician led check-in regarding current stressors and situation, and review of patient completed daily inventory. Clinician utilized active listening and empathetic response and validated patient emotions. Clinician facilitated processing group on pertinent issues.  10:00 -11:00: Clinician introduced topic of "Positive Psychology". Group watched "Positive Psychology" Ted-Talk. Patients discussed how their "lens" of life effects the way they feel. Patients identified two strategies they would be willing to try to change their "lens." 11:00 - 12:00: Clinician led psychotherapy group on "11 beliefs that make life better" and group discussed the ways in which distorted thinking play into their perception of reality.  12:00 - 12:50 Clinician led gratitude activity.  12:50 - 1:00 Clinician led check-out. Clinician assessed for immediate needs, medication compliance and efficacy, and safety concerns.    Suicidal/Homicidal: Nowithout intent/plan  Therapist Response: [name] [name] is a 26 y.o. female who presents with depression symptoms. Pt reports she is feeling "in better spirits" and rates her current mood at a 7 on a 1-10 scale with 10 being great. Pt shares she spoke to her ex yesterday and was able to make some peace with their relationship ending. Pt reports practicing assertiveness with her friends.  Patient engaged in activity and discussion. Patient demonstrates some progress as evidenced by reporting use of deep breathing skill when feeling overwhelmed. Patient denies SI/HI/self-harm thoughts at end of session.  Plan: Pt will work towards  decreasing depression and SI thoughts and increase ability to self manage emotions and distress tolerance.   Diagnosis: Severe recurrent major depression without psychotic features (HCC) [F33.2]    1. Severe recurrent major depression without psychotic features [facility](HCC)     Donia GuilesJenny Jonica Bickhart, LCSW 11/03/2016

## 2016-11-03 NOTE — Therapy (Signed)
[facility] PARTIAL HOSPITALIZATION PROGRAM [address] [place], [place], [address] Phone: [phone]   Fax:  [phone]  Occupational Therapy Treatment  Patient Details  Name: [name] [name] MRN: [id number] Date of Birth: [birth year] Referring Provider: Dr. Carolanne Grumbling  Encounter Date: 11/03/2016      OT End of Session - 11/03/16 1235    Visit Number 3   Number of Visits 6   Date for OT Re-Evaluation 11/13/16   Authorization Type UMR   OT Start Time 1030   OT Stop Time 1130   OT Time Calculation (min) 60 min   Activity Tolerance Patient tolerated treatment well   Behavior During Therapy [facility] for tasks assessed/performed      Past Medical History:  Diagnosis Date  . Asthma   . Chest pain   . Depression   . Pott's disease   . Seizures (HCC)     Past Surgical History:  Procedure Laterality Date  . TONSILLECTOMY      There were no vitals filed for this visit.      Subjective Assessment - 11/03/16 1235    Currently in Pain? No/denies            [facility] OT Assessment - 11/03/16 1235      Assessment   Diagnosis Major depressive disorder     Precautions   Precautions None       OT Treatment Session: Time Management    S: "You have less stress with good time management skills."  O: Time management session completed with emphasis on skills required, effective versus ineffective time management, importance of structure, along with tips and strategies for success. Patient provided with education on the effects of time management in regards to improving physical, emotional, and social well-being including improved ability for focus, decision-making skills, success in school, work, and social commitments, as well as benefits of reduced stress and the experience of greater success in all aspects of daily life.   A: Pt participated in time management occupational therapy treatment session this date within the [facility] program. Pt actively  engaged in discussion on 10 tips for time management. Pt completed worksheets looking at her daily schedule, time wasters, and mine for time. Pt completed problem solving on how to incorporate time for herself as well as planning and working towards small goals versus large tasks that are harder to sustain. At end of session pt identified strategies for improving current time management including using a planner book, sticky notes, or google planner app on her phone.   P: Pt provided with time management strategies to implement during her daily schedule to assist in improving her balance between self-care, school, leisure, and PHP program tasks. OT will follow up with pt on success or struggles with implementation of learned strategies in 1 week.                OT Short Term Goals - 10/29/16 1316      OT SHORT TERM GOAL #1   Title Patient will be educated on strategies to improve psychosocial skills needed to participate fully in all daily, work, and leisure activities.   Time 3   Period Weeks   Status On-going     OT SHORT TERM GOAL #2   Title Patient will be educated on a HEP and independent with implementation of HEP.   Time 3   Period Weeks   Status On-going     OT SHORT TERM GOAL #3  Title Patient will independently apply psychosocial skills and coping mechanisms to her daily activities in order to function independently.   Time 3   Period Weeks   Status On-going     OT SHORT TERM GOAL #4   Status On-going                  Plan - 11/03/16 1235    Rehab Potential Good   OT Frequency 2x / week   OT Duration --  3 weeks   OT Treatment/Interventions Self-care/ADL training;Patient/family education  community reintegration, coping skills training, psychosocial skills training   Consulted and Agree with Plan of Care Patient      Patient will benefit from skilled therapeutic intervention in order to improve the following deficits and impairments:    (decreased coping skills, decreased psychosocial skills)  Visit Diagnosis: Severe recurrent major depression without psychotic features [facility](HCC)    Problem List Patient Active Problem List   Diagnosis Date Noted  . Severe recurrent major depression without psychotic features (HCC) 10/27/2016    Class: Chronic  . Nexplanon in place 05/14/2016  . Right thyroid nodule 01/19/2015  . POTS (postural orthostatic tachycardia syndrome) 11/23/2013  . Palpitations 11/23/2013  . Chest pain   . Nausea and vomiting 11/02/2011  . Vertigo 11/01/2011  . Labyrinthitis 11/01/2011  . Seizure disorder (HCC) 11/01/2011  . Asthma 11/01/2011   Ezra SitesLeslie Virgilene Stryker, OTR/L  [phone] 11/03/2016, 12:35 PM  [facility] PART[facility] PROGRAM [address] [place], [place], [address] Phone: [phone]   Fax:  [phone]  Name: [name] [name] MRN: [id number] Date of Birth: 08/30/1990

## 2016-11-04 ENCOUNTER — Other Ambulatory Visit (HOSPITAL_COMMUNITY): Payer: Self-pay

## 2016-11-05 ENCOUNTER — Ambulatory Visit (HOSPITAL_COMMUNITY): Payer: Self-pay

## 2016-11-05 ENCOUNTER — Other Ambulatory Visit (HOSPITAL_COMMUNITY): Payer: Self-pay

## 2016-11-05 NOTE — Psych (Signed)
[facility] BH PHP THERAPIST PROGRESS NOTE  [name] [id number]  Session Time: 9 -2  Participation Level: Active  Behavioral Response: CasualAlertEuthymic  Type of Therapy: Group Therapy; Psychotherapy, Psychoeducation, Occupational therapy  Treatment Goals addressed: Coping  Interventions: CBT, DBT, Supportive and Reframing  Summary:  9:00 - 10:30 Clinician led check-in regarding current stressors and situation, and review of patient completed daily inventory. Clinician utilized active listening and empathetic response and validated patient emotions. Clinician facilitated processing group on pertinent issues.  10:30 -11:30: OT Group  11:30-12:15: Clinician led psychoeducation group on distress tolerance. Self Soothe skill was introduced and patients discussed how to utilize it. Patients identified when this technique may be helpful in their personal lives.  12:15 - 1:00: Reflection group: Patients encouraged to practice skills and interpersonal techniques or work on mindfulness and relaxation techniques. The importance of self-care and making skills part of a routine to increase usage were stressed.  1:00 - 1:50 Clinician continued topic of "Distress Tolerance". Group discussed "ACCEPTS" and how/when patients can employ this method to help. Patients identified when this technique may be helpful in their personal lives.  1:50 - 2:00 Clinician led check-out. Clinician assessed for immediate needs, medication compliance and efficacy, and safety concerns.   Suicidal/Homicidal: Nowithout intent/plan  Therapist Response: [name] [name] is a 26 y.o. female who presents with depression symptoms. Pt reports she is feeling "pretty good" and rates her current mood at a 7 on a 1-10 scale with 10 being great. Pt reports she had a good weekend and spent some time practicing self-care by taking a hike with her dog and working with her horses.  Pt reports she spoke with her landlord about potentially  moving, and felt good about the discussion.  Pt states she had trouble sleeping Sunday night so she got up and journaled instead of tossing and turning.  Pt reports journaling helps her and she will continue.  Pt reports she used thought stopping over the weekend.  Pt reports she is still staying with her mom because her home has too many reminders of her ex.  Her friends are coming over at the end of the week to help her re-arrange and change things to help her home feel more like hers.  Pt reports she is hopeful about being able to stay there if she can.  Pt states she has enjoyed group and learned a lot, but has to return to work to be able to pay bills. Patient engaged in activity and discussion. Pt reports she will use touch for self-soothing the most, and responded most of "thoughts" for the ACCEPTS skill.  Patient demonstrates some progress as evidenced by reporting use of skills when having trouble sleeping and thought stopping. Patient denies SI/HI/self-harm thoughts at end of session.  Plan: Patient requested to discharge from PHP due to need to return to work.  Pt did show some decrease in depression and anxiety symptoms and increase in coping abilities. Progress was measured by observation, self-report, and scales. Psychiatrist has approved discharge and patient reports alignment with discharge plan. Patient will step down to outpatient therapy and psychiatry, Patient has requested to return to her previous counselor, Mila Palmer (apt. 9/4 @ 3). Patient is scheduled within this agency for psychiatry with Dr. Rene Kocher on 10/26 at 9am. Patient denies any SI/HI at time of discharge.   Diagnosis: Severe recurrent major depression without psychotic features (HCC) [F33.2]    1. Severe recurrent major depression without psychotic features (HCC)  [name] [name], LPCA 11/05/2016

## 2016-11-06 ENCOUNTER — Other Ambulatory Visit (HOSPITAL_COMMUNITY): Payer: Self-pay

## 2016-11-09 ENCOUNTER — Other Ambulatory Visit (HOSPITAL_COMMUNITY): Payer: Self-pay

## 2016-11-10 ENCOUNTER — Ambulatory Visit (HOSPITAL_COMMUNITY): Payer: Self-pay

## 2016-11-10 ENCOUNTER — Other Ambulatory Visit (HOSPITAL_COMMUNITY): Payer: Self-pay

## 2016-11-11 ENCOUNTER — Other Ambulatory Visit (HOSPITAL_COMMUNITY): Payer: Self-pay

## 2016-11-12 ENCOUNTER — Other Ambulatory Visit (HOSPITAL_COMMUNITY): Payer: Self-pay

## 2016-11-12 ENCOUNTER — Ambulatory Visit (HOSPITAL_COMMUNITY): Payer: Self-pay

## 2016-11-13 ENCOUNTER — Other Ambulatory Visit (HOSPITAL_COMMUNITY): Payer: Self-pay

## 2016-11-16 ENCOUNTER — Other Ambulatory Visit (HOSPITAL_COMMUNITY): Payer: Self-pay

## 2016-11-17 ENCOUNTER — Other Ambulatory Visit (HOSPITAL_COMMUNITY): Payer: Self-pay

## 2016-11-17 ENCOUNTER — Ambulatory Visit (HOSPITAL_COMMUNITY): Payer: Self-pay

## 2016-11-18 ENCOUNTER — Other Ambulatory Visit (HOSPITAL_COMMUNITY): Payer: Self-pay

## 2016-11-19 ENCOUNTER — Other Ambulatory Visit (HOSPITAL_COMMUNITY): Payer: Self-pay

## 2016-11-19 ENCOUNTER — Ambulatory Visit (HOSPITAL_COMMUNITY): Payer: Self-pay

## 2016-11-20 ENCOUNTER — Other Ambulatory Visit (HOSPITAL_COMMUNITY): Payer: Self-pay

## 2016-11-23 ENCOUNTER — Other Ambulatory Visit (HOSPITAL_COMMUNITY): Payer: Self-pay

## 2016-11-24 ENCOUNTER — Other Ambulatory Visit (HOSPITAL_COMMUNITY): Payer: Self-pay | Admitting: Psychiatry

## 2016-11-24 ENCOUNTER — Ambulatory Visit (HOSPITAL_COMMUNITY): Payer: Self-pay

## 2016-11-24 ENCOUNTER — Other Ambulatory Visit (HOSPITAL_COMMUNITY): Payer: Self-pay

## 2016-11-24 MED ORDER — DULOXETINE HCL 60 MG PO CPEP: 60.0000 mg | ORAL_CAPSULE | Freq: Every day | ORAL | 2 refills | Status: DC

## 2016-11-25 ENCOUNTER — Other Ambulatory Visit (HOSPITAL_COMMUNITY): Payer: Self-pay

## 2016-11-26 ENCOUNTER — Ambulatory Visit (HOSPITAL_COMMUNITY): Payer: Self-pay

## 2016-11-26 ENCOUNTER — Other Ambulatory Visit (HOSPITAL_COMMUNITY): Payer: Self-pay

## 2016-11-27 ENCOUNTER — Other Ambulatory Visit (HOSPITAL_COMMUNITY): Payer: Self-pay

## 2016-12-25 ENCOUNTER — Ambulatory Visit (INDEPENDENT_AMBULATORY_CARE_PROVIDER_SITE_OTHER): Payer: 59 | Admitting: Psychiatry

## 2016-12-25 ENCOUNTER — Telehealth (HOSPITAL_COMMUNITY): Payer: Self-pay

## 2016-12-25 DIAGNOSIS — Z79899 Other long term (current) drug therapy: Secondary | ICD-10-CM | POA: Diagnosis not present

## 2016-12-25 DIAGNOSIS — Z818 Family history of other mental and behavioral disorders: Secondary | ICD-10-CM | POA: Diagnosis not present

## 2016-12-25 DIAGNOSIS — F3341 Major depressive disorder, recurrent, in partial remission: Secondary | ICD-10-CM | POA: Diagnosis not present

## 2016-12-25 DIAGNOSIS — G4700 Insomnia, unspecified: Secondary | ICD-10-CM

## 2016-12-25 DIAGNOSIS — F411 Generalized anxiety disorder: Secondary | ICD-10-CM | POA: Diagnosis not present

## 2016-12-25 MED ORDER — HYDROXYZINE PAMOATE 25 MG PO CAPS: 25.0000 mg | ORAL_CAPSULE | Freq: Three times a day (TID) | ORAL | 2 refills | Status: DC | PRN

## 2016-12-25 MED ORDER — MIRTAZAPINE 15 MG PO TABS: 15.0000 mg | ORAL_TABLET | Freq: Every day | ORAL | 0 refills | Status: DC

## 2016-12-25 MED ORDER — DULOXETINE HCL 40 MG PO CPEP: 80.0000 mg | ORAL_CAPSULE | Freq: Every day | ORAL | 0 refills | Status: DC

## 2016-12-25 NOTE — Telephone Encounter (Signed)
The pharmacy called back and said they were wrong, they have the 40 mg. Please disregaurd previous message.

## 2016-12-25 NOTE — Telephone Encounter (Signed)
Pharmacy called, patients Duloxetine does not come in 40 mg. HE wanted to know if he could change it to 20 mg 4 tabs a day. I told him this would be okay.

## 2016-12-25 NOTE — Progress Notes (Signed)
BH MD/PA/NP OP Progress Note  12/25/2016 9:41 AM [name] [name]  MRN:  [id number]  Chief Complaint: Establish care after PHP HPI: [name] [name] presents today for psychiatric med management after participation in [facility].  Spent time with the patient building rapport and learning about some of her social history, work life, and family life.  She has an extremely supportive family who has been supporting her as she has been struggling with depression recently.  Trigger for worsening depression was related to a recent breakup.  She reports that she continues to struggle with down and depressed mood, periods of tearfulness, anxiety and panic episodes about 1-2 times per week, passive thoughts about death and dying but no intention to harm herself.  She reports that she is able to continue showing up for work and tries to do some positive activities with her friends.  She reports that she continues to have trouble sleeping at night, and continues to struggle with low appetite.   Regarding medications, she has been tried on multiple SSRI, and is currently on Cymbalta which she has been on for the past 3 years.  She has never been on a dose above 60 mg, and did feel that Cymbalta helped for a long time.  We agreed to increase Cymbalta to 80 mg.  We also agreed to add Remeron 15 mg nightly for sleep and appetite.  She denies any significant medical concerns at this time and agrees to follow-up in 10-12 weeks.  She continues in individual therapy every 2 weeks.  Visit Diagnosis:    ICD-10-CM   1. Anxiety state F41.1 hydrOXYzine (VISTARIL) 25 MG capsule    DULoxetine 40 MG CPEP    mirtazapine (REMERON) 15 MG tablet  2. Recurrent major depressive disorder, in partial remission (HCC) F33.41 DULoxetine 40 MG CPEP    mirtazapine (REMERON) 15 MG tablet    Past Psychiatric History: No prior psychiatric hospitalizations and no prior suicide attempts  Past Medical History:  Past Medical History:  Diagnosis Date   . Asthma   . Chest pain   . Depression   . Pott's disease   . Seizures (HCC)     Past Surgical History:  Procedure Laterality Date  . TONSILLECTOMY      Family Psychiatric History: See intake H&P for full details. Reviewed, with no updates at this time.   Family History:  Family History  Problem Relation Age of Onset  . Thyroid disease Brother        hashimoto's thyroiditis  . Alcohol abuse Mother   . Depression Mother   . Thyroid disease Sister   . Anxiety disorder Sister   . Depression Sister     Social History:  Social History   Social History  . Marital status: Single    Spouse name: N/A  . Number of children: N/A  . Years of education: N/A   Social History Main Topics  . Smoking status: Never Smoker  . Smokeless tobacco: Never Used  . Alcohol use No     Comment: rarely  . Drug use: No  . Sexual activity: No     Comment: NEXPLANON and Nuva Ring   Other Topics Concern  . Not on file   Social History Narrative  . No narrative on file    Allergies:  Allergies  Allergen Reactions  . Penicillins     Rash   . Amoxicillin Rash    Metabolic Disorder Labs: No results found for: HGBA1C, MPG Lab Results  Component Value Date   PROLACTIN 9.3 06/11/2015   No results found for: CHOL, TRIG, HDL, CHOLHDL, VLDL, LDLCALC Lab Results  Component Value Date   TSH 1.26 06/11/2015    Therapeutic Level Labs: No results found for: LITHIUM No results found for: VALPROATE No components found for:  CBMZ  Current Medications: Current Outpatient Prescriptions  Medication Sig Dispense Refill  . albuterol (PROVENTIL HFA;VENTOLIN HFA) 108 (90 BASE) MCG/ACT inhaler Inhale into the lungs every 6 (six) hours as needed for wheezing or shortness of breath.    . cetirizine (ZYRTEC) 10 MG tablet Take 10 mg by mouth daily as needed for allergies.    . DULoxetine 40 MG CPEP Take 80 mg by mouth daily. 180 capsule 0  . etonogestrel (NEXPLANON) 68 MG IMPL implant 1 each by  Subdermal route once.    . hydrOXYzine (VISTARIL) 25 MG capsule Take 1 capsule (25 mg total) by mouth 3 (three) times daily as needed. 30 capsule 2  . mirtazapine (REMERON) 15 MG tablet Take 1 tablet (15 mg total) by mouth at bedtime. 90 tablet 0   No current facility-administered medications for this visit.      Musculoskeletal: Strength & Muscle Tone: within normal limits Gait & Station: normal Patient leans: N/A  Psychiatric Specialty Exam: ROS  There were no vitals taken for this visit.There is no height or weight on file to calculate BMI.  General Appearance: Casual and Fairly Groomed  Eye Contact:  Good  Speech:  Clear and Coherent  Volume:  Normal  Mood:  Dysphoric  Affect:  Congruent  Thought Process:  Goal Directed and Descriptions of Associations: Intact  Orientation:  Full (Time, Place, and Person)  Thought Content: Logical   Suicidal Thoughts:  No  Homicidal Thoughts:  No  Memory:  Immediate;   Fair  Judgement:  Good  Insight:  Good  Psychomotor Activity:  Normal  Concentration:  Concentration: Good  Recall:  Good  Fund of Knowledge: Good  Language: Good  Akathisia:  Negative  Handed:  Right  AIMS (if indicated): not done  Assets:  Communication Skills Desire for Improvement Financial Resources/Insurance Housing Leisure Time Physical Health Social Support Transportation Vocational/Educational  ADL's:  Intact  Cognition: WNL  Sleep:  Poor   Screenings: GAD-7     Counselor from 11/03/2016 in BEHAVIORAL HEALTH PARTIAL HOSPITALIZATION PROGRAM Counselor from 10/28/2016 in BEHAVIORAL HEALTH PARTIAL HOSPITALIZATION PROGRAM  Total GAD-7 Score  8  21    PHQ2-9     Counselor from 11/03/2016 in BEHAVIORAL HEALTH PARTIAL HOSPITALIZATION PROGRAM Counselor from 10/28/2016 in BEHAVIORAL HEALTH PARTIAL HOSPITALIZATION PROGRAM  PHQ-2 Total Score  2  6  PHQ-9 Total Score  10  24       Assessment and Plan:  [name] [name] is a 26 year old female with recurrent  major depressive disorder, currently in partial remission.  She had participated in this clinic in the PHP, and presents today for med management follow-up.  She denies any acute suicidal thoughts or plans, continues to struggle with tearfulness, insomnia, poor motivation, and anxiety.  She has previously had a positive response to Cymbalta and would benefit from an up titration and augmentation.  We will proceed as below and follow-up in 10-12 weeks.  1. Anxiety state   2. Recurrent major depressive disorder, in partial remission (HCC)     Status of current problems: unchanged  Labs Ordered: No orders of the defined types were placed in this encounter.   Labs Reviewed: NA  Collateral  Obtained/Records Reviewed: reviewed PHP group notes and med management  Plan:  Increase Cymbalta to 80 mg daily Initiate Remeron 15 mg nightly Hydroxyzine 25 mg 1-2 times daily as needed for anxiety or sleep Return to clinic in 10 weeks or sooner if needed Continue in individual therapy every 2 weeks  I spent 30 minutes with the patient in direct face-to-face clinical care.  Greater than 50% of this time was spent in counseling and coordination of care with the patient.    Burnard Leigh, MD 12/25/2016, 9:41 AM

## 2017-01-15 ENCOUNTER — Encounter (HOSPITAL_COMMUNITY): Payer: Self-pay | Admitting: Psychiatry

## 2017-01-15 ENCOUNTER — Ambulatory Visit (INDEPENDENT_AMBULATORY_CARE_PROVIDER_SITE_OTHER): Payer: 59 | Admitting: Psychiatry

## 2017-01-15 VITALS — BP 120/72 | HR 113 | Ht 64.0 in | Wt 126.0 lb

## 2017-01-15 DIAGNOSIS — Z79899 Other long term (current) drug therapy: Secondary | ICD-10-CM | POA: Diagnosis not present

## 2017-01-15 DIAGNOSIS — F3341 Major depressive disorder, recurrent, in partial remission: Secondary | ICD-10-CM | POA: Diagnosis not present

## 2017-01-15 DIAGNOSIS — Z811 Family history of alcohol abuse and dependence: Secondary | ICD-10-CM | POA: Diagnosis not present

## 2017-01-15 DIAGNOSIS — Z818 Family history of other mental and behavioral disorders: Secondary | ICD-10-CM

## 2017-01-15 DIAGNOSIS — F411 Generalized anxiety disorder: Secondary | ICD-10-CM | POA: Diagnosis not present

## 2017-01-15 MED ORDER — MIRTAZAPINE 30 MG PO TABS: 30.0000 mg | ORAL_TABLET | Freq: Every day | ORAL | 1 refills | Status: DC

## 2017-01-15 MED ORDER — DULOXETINE HCL 60 MG PO CPEP: 60.0000 mg | ORAL_CAPSULE | Freq: Every day | ORAL | 1 refills | Status: DC

## 2017-01-15 NOTE — Progress Notes (Signed)
BH MD/PA/NP OP Progress Note  01/15/2017 12:04 PM [name] [name]  MRN:  [id number]  Chief Complaint: med management  HPI: [name] [name] reports that she is sleeping dramatically better with Remeron, and reports that she has been a bit sedated with the 15 mg tablet.  I spent time discussing the mechanism of action of Remeron, and suggested that we increase it to 30 mg to reduce some of the sedating effects.  Explained pharmacology of the medication and she was agreeable to this.  She has had difficulty filling Cymbalta 80 mg, so we agreed to maintain at 60 mg with the understanding that Remeron 30 mg an excellent dose of augmentation for depression.  If she does not tolerate the Remeron increase, she is to send this Clinical research associatewriter a message or call so that we can increase Cymbalta to 80 mg, with a 60 mg +20 mg tablet.  She denies any acute safety issues or suicidality.  Reports that she is coping better now that she is in therapy.  Spent time discussing ways that she can make sure to make time for her individual therapy, especially in the context of a busy work schedule.  Visit Diagnosis:    ICD-10-CM   1. Recurrent major depressive disorder, in partial remission (HCC) F33.41 DULoxetine (CYMBALTA) 60 MG capsule    mirtazapine (REMERON) 30 MG tablet  2. Anxiety state F41.1 DULoxetine (CYMBALTA) 60 MG capsule    mirtazapine (REMERON) 30 MG tablet    Past Psychiatric History: See intake H&P for full details. Reviewed, with no updates at this time.   Past Medical History:  Past Medical History:  Diagnosis Date  . Asthma   . Chest pain   . Depression   . Pott's disease   . Seizures (HCC)     Past Surgical History:  Procedure Laterality Date  . TONSILLECTOMY      Family Psychiatric History: See intake H&P for full details. Reviewed, with no updates at this time.   Family History:  Family History  Problem Relation Age of Onset  . Thyroid disease Brother        hashimoto's thyroiditis  .  Alcohol abuse Mother   . Depression Mother   . Thyroid disease Sister   . Anxiety disorder Sister   . Depression Sister     Social History:  Social History   Socioeconomic History  . Marital status: Single    Spouse name: None  . Number of children: None  . Years of education: None  . Highest education level: None  Social Needs  . Financial resource strain: None  . Food insecurity - worry: None  . Food insecurity - inability: None  . Transportation needs - medical: None  . Transportation needs - non-medical: None  Occupational History  . None  Tobacco Use  . Smoking status: Never Smoker  . Smokeless tobacco: Never Used  Substance and Sexual Activity  . Alcohol use: No    Alcohol/week: 0.0 oz    Comment: rarely  . Drug use: No  . Sexual activity: No    Birth control/protection: Implant, Inserts    Comment: NEXPLANON and Nuva Ring  Other Topics Concern  . None  Social History Narrative  . None    Allergies:  Allergies  Allergen Reactions  . Penicillins     Rash   . Amoxicillin Rash    Metabolic Disorder Labs: No results found for: HGBA1C, MPG Lab Results  Component Value Date   PROLACTIN 9.3 06/11/2015  No results found for: CHOL, TRIG, HDL, CHOLHDL, VLDL, LDLCALC Lab Results  Component Value Date   TSH 1.26 06/11/2015    Therapeutic Level Labs: No results found for: LITHIUM No results found for: VALPROATE No components found for:  CBMZ  Current Medications: Current Outpatient Medications  Medication Sig Dispense Refill  . albuterol (PROVENTIL HFA;VENTOLIN HFA) 108 (90 BASE) MCG/ACT inhaler Inhale into the lungs every 6 (six) hours as needed for wheezing or shortness of breath.    . cetirizine (ZYRTEC) 10 MG tablet Take 10 mg by mouth daily as needed for allergies.    . DULoxetine (CYMBALTA) 60 MG capsule Take 1 capsule (60 mg total) daily by mouth. 90 capsule 1  . etonogestrel (NEXPLANON) 68 MG IMPL implant 1 each by Subdermal route once.    .  hydrOXYzine (VISTARIL) 25 MG capsule Take 1 capsule (25 mg total) by mouth 3 (three) times daily as needed. 30 capsule 2  . mirtazapine (REMERON) 30 MG tablet Take 1 tablet (30 mg total) at bedtime by mouth. 90 tablet 1   No current facility-administered medications for this visit.      Musculoskeletal: Strength & Muscle Tone: within normal limits Gait & Station: normal Patient leans: N/A  Psychiatric Specialty Exam: ROS  Blood pressure 120/72, pulse (!) 113, height 5\' 4"  (1.626 m), weight 126 lb (57.2 kg).Body mass index is 21.63 kg/m.  General Appearance: Casual and Well Groomed  Eye Contact:  Good  Speech:  Clear and Coherent  Volume:  Normal  Mood:  better, sleep helping  Affect:  Congruent  Thought Process:  Goal Directed and Descriptions of Associations: Intact  Orientation:  Full (Time, Place, and Person)  Thought Content: Logical   Suicidal Thoughts:  No  Homicidal Thoughts:  No  Memory:  Immediate;   Good  Judgement:  Good  Insight:  Good  Psychomotor Activity:  Normal  Concentration:  Concentration: Good  Recall:  Good  Fund of Knowledge: Good  Language: Good  Akathisia:  Negative  Handed:  Right  AIMS (if indicated): not done  Assets:  Communication Skills Desire for Improvement Financial Resources/Insurance Housing Physical Health Social Support Transportation Vocational/Educational  ADL's:  Intact  Cognition: WNL  Sleep:  Good   Screenings: GAD-7     Counselor from 11/03/2016 in BEHAVIORAL HEALTH PARTIAL HOSPITALIZATION PROGRAM Counselor from 10/28/2016 in BEHAVIORAL HEALTH PARTIAL HOSPITALIZATION PROGRAM  Total GAD-7 Score  8  21    PHQ2-9     Counselor from 11/03/2016 in BEHAVIORAL HEALTH PARTIAL HOSPITALIZATION PROGRAM Counselor from 10/28/2016 in BEHAVIORAL HEALTH PARTIAL HOSPITALIZATION PROGRAM  PHQ-2 Total Score  2  6  PHQ-9 Total Score  10  24       Assessment and Plan:  [name] [name] continues with partial improvement of depressive  symptoms.  We agreed to increase Remeron to 30 mg for a reduction of sedating effects, and to further augment antidepressant Cymbalta.  She is sleeping quite heavily with the 15 mg dose, and I am hopeful we will find a middle ground with 30 mg, or potentially 45 mg of Remeron.  She does not have any acute suicidality and is actively engaged in individual therapy.  She has an excellent support system and continues to be able to work consistently.  We agreed to follow-up in 10-12 weeks or sooner if needed.  1. Recurrent major depressive disorder, in partial remission (HCC)   2. Anxiety state     Status of current problems: gradually improving  Labs  Ordered: No orders of the defined types were placed in this encounter.   Labs Reviewed: N/A  Collateral Obtained/Records Reviewed: n/a  Plan:  Continue Cymbalta 60 mg Increase Remeron to 30 mg nightly If patient is too sedated with Remeron, we can decrease back to 15 mg nightly, and increase Cymbalta to 80 mg daily (60 mg +20 mg) Return to clinic in 10-12 weeks Encourage individual therapy on a consistent weekly basis, encourage the patient to speak with her boss to make sure this time is carved out, we are happy to provide a letter of support  I spent 20 minutes with the patient in direct face-to-face clinical care.  Greater than 50% of this time was spent in counseling and coordination of care with the patient.    Burnard LeighAlexander Arya Eksir, MD 01/15/2017, 12:04 PM

## 2017-04-09 ENCOUNTER — Other Ambulatory Visit (HOSPITAL_COMMUNITY): Payer: Self-pay | Admitting: Psychiatry

## 2017-04-09 DIAGNOSIS — F3341 Major depressive disorder, recurrent, in partial remission: Secondary | ICD-10-CM

## 2017-04-09 DIAGNOSIS — F411 Generalized anxiety disorder: Secondary | ICD-10-CM

## 2017-04-12 ENCOUNTER — Ambulatory Visit (HOSPITAL_COMMUNITY): Payer: 59 | Admitting: Psychiatry

## 2017-09-21 ENCOUNTER — Telehealth (HOSPITAL_COMMUNITY): Payer: Self-pay

## 2017-09-21 NOTE — Telephone Encounter (Signed)
Patient called for a refill on her medication, she has not been seen since November 2018, she was supposed to return in 10 - 12 weeks, but no showed her appointment. I did get her in for tomorrow morning and told her she would probably have to wait until then to get her prescriptions.

## 2017-09-21 NOTE — Telephone Encounter (Signed)
Thank you for the update!

## 2017-09-22 ENCOUNTER — Ambulatory Visit (INDEPENDENT_AMBULATORY_CARE_PROVIDER_SITE_OTHER): Payer: 59 | Admitting: Psychiatry

## 2017-09-22 ENCOUNTER — Encounter (HOSPITAL_COMMUNITY): Payer: Self-pay | Admitting: Psychiatry

## 2017-09-22 DIAGNOSIS — F411 Generalized anxiety disorder: Secondary | ICD-10-CM | POA: Diagnosis not present

## 2017-09-22 DIAGNOSIS — Z811 Family history of alcohol abuse and dependence: Secondary | ICD-10-CM

## 2017-09-22 DIAGNOSIS — F3341 Major depressive disorder, recurrent, in partial remission: Secondary | ICD-10-CM

## 2017-09-22 DIAGNOSIS — Z818 Family history of other mental and behavioral disorders: Secondary | ICD-10-CM | POA: Diagnosis not present

## 2017-09-22 MED ORDER — FLUOXETINE HCL 20 MG PO CAPS: ORAL_CAPSULE | ORAL | 0 refills | Status: DC

## 2017-09-22 MED ORDER — DULOXETINE HCL 30 MG PO CPEP
30.0000 mg | ORAL_CAPSULE | Freq: Every day | ORAL | 0 refills | Status: DC
Start: 2017-09-22 — End: 2019-07-25

## 2017-09-22 NOTE — Progress Notes (Signed)
BH MD/PA/NP OP Progress Note  09/22/2017 9:03 AM [name] [name]:  [id number]  Chief Complaint: med management  HPI: [name] [name] reports that her mood is been much better because of positive changes in her work environment and a promotion, she is in a place where she feels like she has more control over her day-to-day, she is not working chaotic hours.  She reports that she has been having fun with her friends, doing 4 wheeling, sleeping well at night.  She does not take the Remeron at all.  She feels like the Cymbalta wears away at the end of the day and then she has some serotonin withdrawal zapping sensations.  We agreed to switch her from Cymbalta to Prozac for a maintenance medication.  She denies any acute safety concerns and agrees to follow-up with writer in 2-3 months or sooner if needed.  Visit Diagnosis:    ICD-10-CM   1. Recurrent major depressive disorder, in partial remission (HCC) F33.41 DULoxetine (CYMBALTA) 30 MG capsule    FLUoxetine (PROZAC) 20 MG capsule  2. Anxiety state F41.1 DULoxetine (CYMBALTA) 30 MG capsule    Past Psychiatric History: See intake H&P for full details. Reviewed, with no updates at this time.  Past Medical History:  Past Medical History:  Diagnosis Date  . Asthma   . Chest pain   . Depression   . Pott's disease   . Seizures (HCC)     Past Surgical History:  Procedure Laterality Date  . TONSILLECTOMY      Family Psychiatric History: See intake H&P for full details. Reviewed, with no updates at this time.   Family History:  Family History  Problem Relation Age of Onset  . Thyroid disease Brother        hashimoto's thyroiditis  . Alcohol abuse Mother   . Depression Mother   . Thyroid disease Sister   . Anxiety disorder Sister   . Depression Sister     Social History:  Social History   Socioeconomic History  . Marital status: Single    Spouse name: Not on file  . Number of children: Not on file  . Years of education: Not  on file  . Highest education level: Not on file  Occupational History  . Not on file  Social Needs  . Financial resource strain: Not on file  . Food insecurity:    Worry: Not on file    Inability: Not on file  . Transportation needs:    Medical: Not on file    Non-medical: Not on file  Tobacco Use  . Smoking status: Never Smoker  . Smokeless tobacco: Never Used  Substance and Sexual Activity  . Alcohol use: No    Alcohol/week: 0.0 oz    Comment: rarely  . Drug use: No  . Sexual activity: Never    Birth control/protection: Implant, Inserts    Comment: NEXPLANON and Nuva Ring  Lifestyle  . Physical activity:    Days per week: Not on file    Minutes per session: Not on file  . Stress: Not on file  Relationships  . Social connections:    Talks on phone: Not on file    Gets together: Not on file    Attends religious service: Not on file    Active member of club or organization: Not on file    Attends meetings of clubs or organizations: Not on file    Relationship status: Not on file  Other Topics Concern  .  Not on file  Social History Narrative  . Not on file    Allergies:  Allergies  Allergen Reactions  . Penicillins     Rash   . Amoxicillin Rash    Metabolic Disorder Labs: No results found for: HGBA1C, MPG Lab Results  Component Value Date   PROLACTIN 9.3 06/11/2015   No results found for: CHOL, TRIG, HDL, CHOLHDL, VLDL, LDLCALC Lab Results  Component Value Date   TSH 1.26 06/11/2015    Therapeutic Level Labs: No results found for: LITHIUM No results found for: VALPROATE No components found for:  CBMZ  Current Medications: Current Outpatient Medications  Medication Sig Dispense Refill  . cetirizine (ZYRTEC) 10 MG tablet Take 10 mg by mouth daily as needed for allergies.    Marland Kitchen etonogestrel (NEXPLANON) 68 MG IMPL implant 1 each by Subdermal route once.    Marland Kitchen albuterol (PROVENTIL HFA;VENTOLIN HFA) 108 (90 BASE) MCG/ACT inhaler Inhale into the lungs  every 6 (six) hours as needed for wheezing or shortness of breath.    . DULoxetine (CYMBALTA) 30 MG capsule Take 1 capsule (30 mg total) by mouth daily for 7 doses. 7 capsule 0  . FLUoxetine (PROZAC) 20 MG capsule Take 1 capsule (20 mg total) by mouth daily for 7 days, THEN 2 capsules (40 mg total) daily. 187 capsule 0   No current facility-administered medications for this visit.     Musculoskeletal: Strength & Muscle Tone: within normal limits Gait & Station: normal Patient leans: N/A  Psychiatric Specialty Exam: ROS  Blood pressure 130/84, pulse 73, height 5\' 4"  (1.626 m), weight 124 lb (56.2 kg), SpO2 99 %.Body mass index is 21.28 kg/m.  General Appearance: Casual and Well Groomed  Eye Contact:  Good  Speech:  Clear and Coherent  Volume:  Normal  Mood:  Euthymic and happy, in a good place  Affect:  Congruent  Thought Process:  Goal Directed and Descriptions of Associations: Intact  Orientation:  Full (Time, Place, and Person)  Thought Content: Logical   Suicidal Thoughts:  No  Homicidal Thoughts:  No  Memory:  Immediate;   Good  Judgement:  Good  Insight:  Good  Psychomotor Activity:  Normal  Concentration:  Concentration: Good  Recall:  Good  Fund of Knowledge: Good  Language: Good  Akathisia:  Negative  Handed:  Right  AIMS (if indicated): not done  Assets:  Communication Skills Desire for Improvement Financial Resources/Insurance Housing Physical Health Social Support Transportation Vocational/Educational  ADL's:  Intact  Cognition: WNL  Sleep:  Good   Screenings: GAD-7     Counselor from 11/03/2016 in BEHAVIORAL HEALTH PARTIAL HOSPITALIZATION PROGRAM Counselor from 10/28/2016 in BEHAVIORAL HEALTH PARTIAL HOSPITALIZATION PROGRAM  Total GAD-7 Score  8  21    PHQ2-9     Counselor from 11/03/2016 in BEHAVIORAL HEALTH PARTIAL HOSPITALIZATION PROGRAM Counselor from 10/28/2016 in BEHAVIORAL HEALTH PARTIAL HOSPITALIZATION PROGRAM  PHQ-2 Total Score  2  6  PHQ-9  Total Score  10  24      Assessment and Plan:  [name] [name] presents with relatively stable mood symptoms.  She has some serotonin withdrawal symptoms with Cymbalta, I wonder if she may be a hyper-metabolizer.  Given her history of recurrent major depressive disorder I do think she needs to be on a preventative SSRI.  We agreed to switch her to Prozac for a maintenance medication and discussed the cross taper as below.  No acute safety issues and we will follow-up in 2-3 months.  Disclosed  to patient that this writer is leaving this practice at the end of August 2019, and patients always has the right to choose their provider. Reassured patient that office will work to provide smooth transition of care whether they wish to remain at this office, or to continue with this provider, or seek alternative care options in community.  They expressed understanding.   1. Recurrent major depressive disorder, in partial remission (HCC)   2. Anxiety state     Status of current problems: gradually improving  Labs Ordered: No orders of the defined types were placed in this encounter.   Labs Reviewed: N/A  Collateral Obtained/Records Reviewed: n/a  Plan:  Start Prozac 20 mg, increase to 40 mg in 1 week Decrease Cymbalta to 30 mg for 1 week then stop Follow-up in 8 weeks  Burnard Leigh, MD 09/22/2017, 9:03 AM

## 2017-09-22 NOTE — Patient Instructions (Signed)
Take Cymbalta 30 + Prozac 20 mg for 1 week  After 1 week, stop the Cymbalta completely, and increase Prozac to 2 capsules (40 mg)

## 2017-11-17 DIAGNOSIS — F3341 Major depressive disorder, recurrent, in partial remission: Secondary | ICD-10-CM | POA: Diagnosis not present

## 2017-11-22 ENCOUNTER — Encounter (HOSPITAL_COMMUNITY): Payer: Self-pay | Admitting: Emergency Medicine

## 2017-11-22 ENCOUNTER — Ambulatory Visit (HOSPITAL_COMMUNITY)
Admission: EM | Admit: 2017-11-22 | Discharge: 2017-11-22 | Disposition: A | Discharge status: Home / Self Care | Payer: 59 | Attending: Family Medicine | Admitting: Family Medicine

## 2017-11-22 DIAGNOSIS — I951 Orthostatic hypotension: Secondary | ICD-10-CM

## 2017-11-22 DIAGNOSIS — R000 Tachycardia, unspecified: Secondary | ICD-10-CM

## 2017-11-22 DIAGNOSIS — G90A Postural orthostatic tachycardia syndrome (POTS): Secondary | ICD-10-CM

## 2017-11-22 DIAGNOSIS — R0789 Other chest pain: Secondary | ICD-10-CM

## 2017-11-22 DIAGNOSIS — I498 Other specified cardiac arrhythmias: Secondary | ICD-10-CM

## 2017-11-22 MED ORDER — PANTOPRAZOLE SODIUM 20 MG PO TBEC: 20.0000 mg | DELAYED_RELEASE_TABLET | Freq: Every day | ORAL | 0 refills | Status: DC

## 2017-11-22 NOTE — ED Triage Notes (Signed)
Pt states shes had chest pain x2 weeks off and on, worse since Friday. Hx of asthma. States on Friday she checked her BP and it was in the 70s. Feeling lightheaded.

## 2017-11-22 NOTE — Discharge Instructions (Signed)
It was nice seeing you today. No acute findings on your EKG. Chest pain might be related to reflux vs anxiety vs musculoskeletal pain. Please try Protonix for reflex for 4 weeks and see if this will improve your symptoms. See Cardiologist soon for management of POTS and cardiac evaluation. Tylenol as needed for pain,.F/U as needed.

## 2017-11-22 NOTE — ED Provider Notes (Signed)
[facility]    CSN: [id number] Arrival date & time: 11/22/17  1731     History   Chief Complaint Chief Complaint  Patient presents with  . Chest Pain    HPI [name] [name] is a 27 y.o. female.   The history is provided by the patient. No language interpreter was used.  Chest Pain  Pain location:  L chest and substernal area Pain quality: sharp   Pain radiates to:  Does not radiate Pain severity:  Moderate (4/10 now) Onset quality:  Gradual Duration:  14 days Timing:  Constant Progression:  Waxing and waning Chronicity:  New Context: eating   Context: not lifting, not stress and not trauma   Context comment:  Few days ago when she exhale it was aggravated. At times when she eats she gets congested in her chest and cough up sputum or food Relieved by:  Nothing Worsened by:  Nothing Ineffective treatments: Inhaler and mucinex. Associated symptoms: cough and palpitations   Associated symptoms: no abdominal pain, no anxiety, no nausea, no shortness of breath, no syncope and no vomiting   Associated symptoms comment:  Felt dizzy few days ago. About 3 days ago, her systolic BP was in the 70s and a HR of 132, she has hx of hypotension in the past for which she was medicated. She had not needed medication for over 2 years.  Endorsed occasional palpitations, currently not present.   Past Medical History:  Diagnosis Date  . Asthma   . Chest pain   . Depression   . Pott's disease   . Seizures [facility])     Patient Active Problem List   Diagnosis Date Noted  . Severe recurrent major depression without psychotic features (HCC) 10/27/2016    Class: Chronic  . Nexplanon in place 05/14/2016  . Right thyroid nodule 01/19/2015  . POTS (postural orthostatic tachycardia syndrome) 11/23/2013  . Palpitations 11/23/2013  . Chest pain   . Nausea and vomiting 11/02/2011  . Vertigo 11/01/2011  . Labyrinthitis 11/01/2011  . Seizure disorder (HCC) 11/01/2011  . Asthma  11/01/2011    Past Surgical History:  Procedure Laterality Date  . TONSILLECTOMY      OB History    Gravida  0   Para  0   Term  0   Preterm  0   AB  0   Living  0     SAB  0   TAB  0   Ectopic  0   Multiple  0   Live Births               Home Medications    Prior to Admission medications   Medication Sig Start Date End Date Taking? Authorizing Provider  albuterol (PROVENTIL HFA;VENTOLIN HFA) 108 (90 BASE) MCG/ACT inhaler Inhale into the lungs every 6 (six) hours as needed for wheezing or shortness of breath.    [provider]  cetirizine (ZYRTEC) 10 MG tablet Take 10 mg by mouth daily as needed for allergies.    [provider]  DULoxetine (CYMBALTA) 30 MG capsule Take 1 capsule (30 mg total) by mouth daily for 7 doses. Patient not taking: Reported on 11/22/2017 09/22/17 09/29/17  Burnard Leigh, MD  etonogestrel (NEXPLANON) 68 MG IMPL implant 1 each by Subdermal route once.    [provider]  FLUoxetine (PROZAC) 20 MG capsule Take 1 capsule (20 mg total) by mouth daily for 7 days, THEN 2 capsules (40 mg total) daily. 09/22/17  12/28/17  Eksir, Bo Mcclintock, MD    Family History Family History  Problem Relation Age of Onset  . Thyroid disease Brother        hashimoto's thyroiditis  . Alcohol abuse Mother   . Depression Mother   . Thyroid disease Sister   . Anxiety disorder Sister   . Depression Sister     Social History Social History   Tobacco Use  . Smoking status: Never Smoker  . Smokeless tobacco: Never Used  Substance Use Topics  . Alcohol use: No    Alcohol/week: 0.0 standard drinks    Comment: rarely  . Drug use: No     Allergies   Penicillins and Amoxicillin   Review of Systems Review of Systems  Respiratory: Positive for cough. Negative for shortness of breath.   Cardiovascular: Positive for chest pain and palpitations. Negative for syncope.  Gastrointestinal: Negative for abdominal pain,  nausea and vomiting.  Musculoskeletal: Negative.   All other systems reviewed and are negative.    Physical Exam Triage Vital Signs ED Triage Vitals [11/22/17 1810]  Enc Vitals Group     BP 129/79     Pulse Rate 75     Resp 16     Temp 98.3 F (36.8 C)     Temp src      SpO2 100 %     Weight      Height      Head Circumference      Peak Flow      Pain Score      Pain Loc      Pain Edu?      Excl. in GC?    No data found.  Updated Vital Signs BP 129/79   Pulse 75   Temp 98.3 F (36.8 C)   Resp 16   SpO2 100%   Visual Acuity Right Eye Distance:   Left Eye Distance:   Bilateral Distance:    Right Eye Near:   Left Eye Near:    Bilateral Near:     Physical Exam  Constitutional: She is oriented to person, place, and time. She appears well-developed. She does not appear ill.  Neck: Neck supple.  Cardiovascular: Normal rate and regular rhythm.  No murmur heard. Pulmonary/Chest: Effort normal and breath sounds normal. No tachypnea. No respiratory distress. She has no decreased breath sounds. She has no wheezes. She has no rhonchi.  Abdominal: Soft. Bowel sounds are normal. She exhibits no distension and no mass.  Musculoskeletal:       Right lower leg: She exhibits no edema.  Neurological: She is alert and oriented to person, place, and time.  Skin: Capillary refill takes less than 2 seconds.  Nursing note and vitals reviewed.    UC Treatments / Results  Labs (all labs ordered are listed, but only abnormal results are displayed) Labs Reviewed - No data to display  EKG None  Radiology No results found.  Procedures Procedures (including critical care time)  Medications Ordered in UC Medications - No data to display  Initial Impression / Assessment and Plan / UC Course  I have reviewed the triage vital signs and the nursing notes.  Pertinent labs & imaging results that were available during my care of the patient were reviewed by me and considered  in my medical decision making (see chart for details).  Clinical Course as of Nov 22 1900  Mon Nov 22, 2017  1900 Chest pain sounds atypical. EKG done and reviewed by me.  NSR @ 70 bpm,non-specific T wave changes similar to EKG done May/2017. ?? GERD since symptoms is related to eating as well. Trial of PPI for 4 weeks. PCP f/u soon if no improvement or if symptoms worsens. She will need referral to Cards soon given her previous hx. Tylenol as needed for pain. ED precaution given.    [KE]  1902 POTS hx. BP and HR normal during this visit. Cards f/u recommended for further management.   [KE]    Clinical Course User Index [KE] Doreene ElandEniola, Ellise Kovack T, MD    Atypical chest pain  POTS (postural orthostatic tachycardia syndrome)   Final Clinical Impressions(s) / UC Diagnoses   Final diagnoses:  None   Discharge Instructions   None    ED Prescriptions    None     Controlled Substance Prescriptions [place] Controlled Substance Registry consulted? Not Applicable   Doreene ElandEniola, Alysah Carton T, MD 11/22/17 1904

## 2018-01-31 DIAGNOSIS — J069 Acute upper respiratory infection, unspecified: Secondary | ICD-10-CM | POA: Diagnosis not present

## 2018-01-31 DIAGNOSIS — J029 Acute pharyngitis, unspecified: Secondary | ICD-10-CM | POA: Diagnosis not present

## 2018-02-20 DIAGNOSIS — S134XXA Sprain of ligaments of cervical spine, initial encounter: Secondary | ICD-10-CM | POA: Diagnosis not present

## 2018-05-18 DIAGNOSIS — H5213 Myopia, bilateral: Secondary | ICD-10-CM | POA: Diagnosis not present

## 2018-10-06 ENCOUNTER — Encounter: Payer: Self-pay | Admitting: Emergency Medicine

## 2018-10-06 ENCOUNTER — Ambulatory Visit
Admission: EM | Admit: 2018-10-06 | Discharge: 2018-10-06 | Disposition: A | Discharge status: Home / Self Care | Payer: 59 | Attending: Emergency Medicine | Admitting: Emergency Medicine

## 2018-10-06 ENCOUNTER — Other Ambulatory Visit: Payer: Self-pay

## 2018-10-06 ENCOUNTER — Ambulatory Visit (INDEPENDENT_AMBULATORY_CARE_PROVIDER_SITE_OTHER): Payer: 59

## 2018-10-06 DIAGNOSIS — J189 Pneumonia, unspecified organism: Secondary | ICD-10-CM

## 2018-10-06 DIAGNOSIS — J8489 Other specified interstitial pulmonary diseases: Secondary | ICD-10-CM | POA: Diagnosis not present

## 2018-10-06 MED ORDER — PREDNISONE 20 MG PO TABS: 20.0000 mg | ORAL_TABLET | Freq: Every day | ORAL | 0 refills | Status: AC

## 2018-10-06 NOTE — ED Triage Notes (Signed)
Back soreness with inhalation.  Sometimes feels chest soreness.    [place] of gulping and breathing lake water.  Since then has felt soreness.    Denies fever.  Patient reports phlegm in chest and throat.

## 2018-10-06 NOTE — Discharge Instructions (Addendum)
Start as prescribed. Return for worsening pain, difficulty breathing, cough, fever.

## 2018-10-06 NOTE — ED Provider Notes (Signed)
[facility]    CSN: [id number] Arrival date & time: 10/06/18  1740     History   Chief Complaint Chief Complaint  Patient presents with  . Pleurisy  . Back Pain    HPI [name] [name] is a 28 y.o. female with history of asthma presenting for pleuritic chest pain, cough status post water inhalation on Sunday.  Patient states he was admitted [facility], tubing, inhaled a lot of water.  Patient states that she coughed a lot and vomited: No vomiting since then.  Patient denies fever, malaise, fatigue, known sick exposures, chest pain, shortness of breath.  Patient is downtrending for this mother states she does have an inhaler at home.   Past Medical History:  Diagnosis Date  . Asthma   . Chest pain   . Depression   . Pott's disease   . Seizures [facility])     Patient Active Problem List   Diagnosis Date Noted  . Severe recurrent major depression without psychotic features ([place]) 10/27/2016    Class: Chronic  . Nexplanon in place 05/14/2016  . Right thyroid nodule 01/19/2015  . POTS (postural orthostatic tachycardia syndrome) 11/23/2013  . Palpitations 11/23/2013  . Chest pain   . Nausea and vomiting 11/02/2011  . Vertigo 11/01/2011  . Labyrinthitis 11/01/2011  . Seizure disorder ([place]) 11/01/2011  . Asthma 11/01/2011    Past Surgical History:  Procedure Laterality Date  . TONSILLECTOMY      OB History    Gravida  0   Para  0   Term  0   Preterm  0   AB  0   Living  0     SAB  0   TAB  0   Ectopic  0   Multiple  0   Live Births               Home Medications    Prior to Admission medications   Medication Sig Start Date End Date Taking? Authorizing Provider  cetirizine (ZYRTEC) 10 MG tablet Take 10 mg by mouth daily as needed for allergies.   Yes [provider]  etonogestrel (NEXPLANON) 68 MG IMPL implant 1 each by Subdermal route once.   Yes [provider]  albuterol (PROVENTIL HFA;VENTOLIN HFA) 108 (90 BASE)  MCG/ACT inhaler Inhale into the lungs every 6 (six) hours as needed for wheezing or shortness of breath.    [provider]  DULoxetine (CYMBALTA) 30 MG capsule Take 1 capsule (30 mg total) by mouth daily for 7 doses. Patient not taking: Reported on 11/22/2017 09/22/17 09/29/17  Aundra Dubin, MD  FLUoxetine (PROZAC) 20 MG capsule Take 1 capsule (20 mg total) by mouth daily for 7 days, THEN 2 capsules (40 mg total) daily. 09/22/17 12/28/17  Aundra Dubin, MD  pantoprazole (PROTONIX) 20 MG tablet Take 1 tablet (20 mg total) by mouth daily. 11/22/17 12/23/17  Kinnie Feil, MD  predniSONE (DELTASONE) 20 MG tablet Take 1 tablet (20 mg total) by mouth daily for 7 days. 10/06/18 10/13/18  Hall-Potvin, [place], PA-C    Family History Family History  Problem Relation Age of Onset  . Thyroid disease Brother        hashimoto's thyroiditis  . Alcohol abuse Mother   . Depression Mother   . Thyroid disease Sister   . Anxiety disorder Sister   . Depression Sister     Social History Social History   Tobacco Use  . Smoking status: Never Smoker  .  Smokeless tobacco: Never Used  Substance Use Topics  . Alcohol use: No    Alcohol/week: 0.0 standard drinks    Comment: rarely  . Drug use: No     Allergies   Penicillins and Amoxicillin   Review of Systems Review of Systems  Constitutional: Negative for appetite change, fatigue and fever.  HENT: Negative for ear pain, sinus pain, sore throat and voice change.   Eyes: Negative for pain, redness and visual disturbance.  Respiratory: Positive for cough. Negative for shortness of breath.   Cardiovascular: Negative for palpitations.       Positive for pleuritic chest pain  Gastrointestinal: Negative for abdominal pain, diarrhea and vomiting.  Musculoskeletal: Negative for arthralgias and myalgias.  Skin: Negative for rash and wound.  Neurological: Negative for syncope and headaches.     Physical Exam Triage Vital  Signs ED Triage Vitals  Enc Vitals Group     BP 10/06/18 1750 134/87     Pulse Rate 10/06/18 1750 81     Resp 10/06/18 1750 16     Temp 10/06/18 1750 98.5 F (36.9 C)     Temp Source 10/06/18 1750 Oral     SpO2 10/06/18 1750 99 %     Weight --      Height --      Head Circumference --      Peak Flow --      Pain Score 10/06/18 1800 4     Pain Loc --      Pain Edu? --      Excl. in [place]? --    No data found.  Updated Vital Signs BP 134/87 (BP Location: Left Arm)   Pulse 81   Temp 98.5 F (36.9 C) (Oral)   Resp 16   SpO2 99%   Visual Acuity Right Eye Distance:   Left Eye Distance:   Bilateral Distance:    Right Eye Near:   Left Eye Near:    Bilateral Near:     Physical Exam Constitutional:      General: She is not in acute distress.    Appearance: She is normal weight. She is not ill-appearing.  HENT:     Head: Normocephalic and atraumatic.     Right Ear: Tympanic membrane, ear canal and external ear normal.     Left Ear: Tympanic membrane, ear canal and external ear normal.     Mouth/Throat:     Mouth: Mucous membranes are moist.     Pharynx: Oropharynx is clear. No oropharyngeal exudate or posterior oropharyngeal erythema.  Eyes:     General: No scleral icterus.       Right eye: No discharge.        Left eye: No discharge.     Conjunctiva/sclera: Conjunctivae normal.     Pupils: Pupils are equal, round, and reactive to light.  Neck:     Musculoskeletal: Normal range of motion and neck supple. No muscular tenderness.     Comments: Trachea midline. Cardiovascular:     Rate and Rhythm: Normal rate.  Pulmonary:     Effort: Pulmonary effort is normal. No respiratory distress.     Breath sounds: Normal breath sounds. No wheezing or rales.  Lymphadenopathy:     Cervical: No cervical adenopathy.  Skin:    Coloration: Skin is not jaundiced or pale.  Neurological:     Mental Status: She is alert and oriented to person, place, and time.      UC Treatments /  Results  Labs (all labs ordered are listed, but only abnormal results are displayed) Labs Reviewed - No data to display  EKG   Radiology Dg Chest 2 View  Result Date: 10/06/2018 CLINICAL DATA:  Aspiration of [place] water. EXAM: CHEST - 2 VIEW COMPARISON:  Chest x-ray from 2012 FINDINGS: The cardiac silhouette, mediastinal and hilar contours are normal. There are increased interstitial markings, peribronchial thickening mainly in the lower lung zones. Findings could suggest bronchitis or interstitial pneumonitis. No focal airspace consolidation or pleural effusion. The bony thorax is intact. IMPRESSION: 1. Peribronchial thickening and increased interstitial markings mainly in the lower lung zones could suggest bronchitis/bronchial inflammation or interstitial pneumonitis. 2. No focal infiltrate or pleural effusion. Electronically Signed   By: Marijo Sanes M.D.   On: 10/06/2018 18:52    Procedures Procedures (including critical care time)  Medications Ordered in UC Medications - No data to display  Initial Impression / Assessment and Plan / UC Course  I have reviewed the triage vital signs and the nursing notes.  Pertinent labs & imaging results that were available during my care of the patient were reviewed by me and considered in my medical decision making (see chart for details).     1.  Pneumonitis Second to water inhalation.  Patient hemodynamically stable, nontoxic in office: Low concern for MAC at this time.  Chest x-ray done, reviewed by me and radiology, "positive for peribronchial thickening and increased interstitial markings in the lower lung zones.  No local infiltrate or pleural effusion ".  Reviewed findings with the patient, will treat with 1 week course of prednisone with PCP follow up.  Return precautions discussed, patient verbalized understanding and is agreeable to plan. Final Clinical Impressions(s) / UC Diagnoses   Final diagnoses:  Pneumonitis     Discharge  Instructions     Start as prescribed. Return for worsening pain, difficulty breathing, cough, fever.    ED Prescriptions    Medication Sig Dispense Auth. Provider   predniSONE (DELTASONE) 20 MG tablet Take 1 tablet (20 mg total) by mouth daily for 7 days. 7 tablet Hall-Potvin, [place], PA-C     Controlled Substance Prescriptions  Controlled Substance Registry consulted? Not Applicable   Quincy Sheehan, [place] 10/06/18 1683

## 2018-12-09 DIAGNOSIS — F331 Major depressive disorder, recurrent, moderate: Secondary | ICD-10-CM | POA: Diagnosis not present

## 2018-12-28 DIAGNOSIS — F331 Major depressive disorder, recurrent, moderate: Secondary | ICD-10-CM | POA: Diagnosis not present

## 2019-02-20 DIAGNOSIS — F331 Major depressive disorder, recurrent, moderate: Secondary | ICD-10-CM | POA: Diagnosis not present

## 2019-05-27 DIAGNOSIS — F331 Major depressive disorder, recurrent, moderate: Secondary | ICD-10-CM | POA: Diagnosis not present

## 2019-07-04 ENCOUNTER — Other Ambulatory Visit (HOSPITAL_COMMUNITY)
Admission: RE | Admit: 2019-07-04 | Discharge: 2019-07-04 | Disposition: A | Discharge status: Home / Self Care | Payer: 59 | Source: Ambulatory Visit | Attending: Family Medicine | Admitting: Family Medicine

## 2019-07-04 ENCOUNTER — Other Ambulatory Visit: Payer: Self-pay | Admitting: Family Medicine

## 2019-07-04 DIAGNOSIS — Z13220 Encounter for screening for lipoid disorders: Secondary | ICD-10-CM | POA: Diagnosis not present

## 2019-07-04 DIAGNOSIS — J4520 Mild intermittent asthma, uncomplicated: Secondary | ICD-10-CM | POA: Diagnosis not present

## 2019-07-04 DIAGNOSIS — F3341 Major depressive disorder, recurrent, in partial remission: Secondary | ICD-10-CM | POA: Diagnosis not present

## 2019-07-04 DIAGNOSIS — Z131 Encounter for screening for diabetes mellitus: Secondary | ICD-10-CM | POA: Diagnosis not present

## 2019-07-04 DIAGNOSIS — Z308 Encounter for other contraceptive management: Secondary | ICD-10-CM | POA: Diagnosis not present

## 2019-07-04 DIAGNOSIS — Z124 Encounter for screening for malignant neoplasm of cervix: Secondary | ICD-10-CM | POA: Diagnosis not present

## 2019-07-04 DIAGNOSIS — Z130 Encounter for screening for diseases of the blood and blood-forming organs and certain disorders involving the immune mechanism: Secondary | ICD-10-CM | POA: Diagnosis not present

## 2019-07-04 DIAGNOSIS — F418 Other specified anxiety disorders: Secondary | ICD-10-CM | POA: Diagnosis not present

## 2019-07-04 DIAGNOSIS — Z0000 Encounter for general adult medical examination without abnormal findings: Secondary | ICD-10-CM | POA: Diagnosis not present

## 2019-07-06 LAB — CYTOLOGY - PAP: Diagnosis: NEGATIVE

## 2019-07-11 DIAGNOSIS — Z3046 Encounter for surveillance of implantable subdermal contraceptive: Secondary | ICD-10-CM | POA: Diagnosis not present

## 2019-07-19 DIAGNOSIS — Z87898 Personal history of other specified conditions: Secondary | ICD-10-CM | POA: Diagnosis not present

## 2019-07-19 DIAGNOSIS — R000 Tachycardia, unspecified: Secondary | ICD-10-CM | POA: Diagnosis not present

## 2019-07-19 DIAGNOSIS — R55 Syncope and collapse: Secondary | ICD-10-CM | POA: Diagnosis not present

## 2019-07-20 ENCOUNTER — Emergency Department (HOSPITAL_COMMUNITY)
Admission: EM | Admit: 2019-07-20 | Discharge: 2019-07-20 | Disposition: A | Discharge status: Home / Self Care | Payer: 59 | Attending: Emergency Medicine | Admitting: Emergency Medicine

## 2019-07-20 ENCOUNTER — Encounter (HOSPITAL_COMMUNITY): Payer: Self-pay

## 2019-07-20 ENCOUNTER — Other Ambulatory Visit: Payer: Self-pay

## 2019-07-20 DIAGNOSIS — R0789 Other chest pain: Secondary | ICD-10-CM | POA: Diagnosis not present

## 2019-07-20 DIAGNOSIS — R55 Syncope and collapse: Secondary | ICD-10-CM | POA: Insufficient documentation

## 2019-07-20 DIAGNOSIS — R42 Dizziness and giddiness: Secondary | ICD-10-CM | POA: Diagnosis not present

## 2019-07-20 DIAGNOSIS — Z79899 Other long term (current) drug therapy: Secondary | ICD-10-CM | POA: Diagnosis not present

## 2019-07-20 DIAGNOSIS — Z8669 Personal history of other diseases of the nervous system and sense organs: Secondary | ICD-10-CM | POA: Insufficient documentation

## 2019-07-20 DIAGNOSIS — R531 Weakness: Secondary | ICD-10-CM | POA: Diagnosis not present

## 2019-07-20 LAB — BASIC METABOLIC PANEL
Anion gap: 10 (ref 5–15)
BUN: 15 mg/dL (ref 6–20)
CO2: 25 mmol/L (ref 22–32)
Calcium: 9.4 mg/dL (ref 8.9–10.3)
Chloride: 101 mmol/L (ref 98–111)
Creatinine, Ser: 0.91 mg/dL (ref 0.44–1.00)
GFR calc Af Amer: >60 mL/min (ref >60)
GFR calc non Af Amer: >60 mL/min (ref >60)
Glucose, Bld: 117 mg/dL — ABNORMAL HIGH (ref 70–99)
Potassium: 3.8 mmol/L (ref 3.5–5.1)
Sodium: 136 mmol/L (ref 135–145)

## 2019-07-20 LAB — URINALYSIS, ROUTINE W REFLEX MICROSCOPIC
Bacteria, UA: NONE SEEN
Bilirubin Urine: NEGATIVE
Glucose, UA: NEGATIVE mg/dL
Hgb urine dipstick: NEGATIVE
Ketones, ur: NEGATIVE mg/dL
Nitrite: NEGATIVE
Protein, ur: NEGATIVE mg/dL
Specific Gravity, Urine: 1.011 (ref 1.005–1.030)
pH: 6 (ref 5.0–8.0)

## 2019-07-20 LAB — D-DIMER, QUANTITATIVE: D-Dimer, Quant: <0.27 ug/mL-FEU (ref 0.00–0.50)

## 2019-07-20 LAB — TROPONIN I (HIGH SENSITIVITY)
Troponin I (High Sensitivity): <2 ng/L (ref <18)
Troponin I (High Sensitivity): <2 ng/L (ref <18)

## 2019-07-20 LAB — I-STAT BETA HCG BLOOD, ED (MC, WL, AP ONLY): I-stat hCG, quantitative: <5 m[IU]/mL (ref <5)

## 2019-07-20 LAB — CBC
HCT: 39.8 % (ref 36.0–46.0)
Hemoglobin: 13.4 g/dL (ref 12.0–15.0)
MCH: 30 pg (ref 26.0–34.0)
MCHC: 33.7 g/dL (ref 30.0–36.0)
MCV: 89 fL (ref 80.0–100.0)
Platelets: 296 10*3/uL (ref 150–400)
RBC: 4.47 MIL/uL (ref 3.87–5.11)
RDW: 12 % (ref 11.5–15.5)
WBC: 7.7 10*3/uL (ref 4.0–10.5)
nRBC: 0 % (ref 0.0–0.2)

## 2019-07-20 LAB — CBG MONITORING, ED: Glucose-Capillary: 119 mg/dL — ABNORMAL HIGH (ref 70–99)

## 2019-07-20 MED ORDER — SODIUM CHLORIDE 0.9% FLUSH: 3.0000 mL | Freq: Once | INTRAVENOUS | Status: DC

## 2019-07-20 NOTE — ED Notes (Signed)
Patient verbalizes understanding of discharge instructions. Opportunity for questioning and answers were provided. Armband removed by staff, pt discharged from ED.  

## 2019-07-20 NOTE — ED Triage Notes (Signed)
Pt arrives to ED w/ c/o syncope at work. Pt also had syncopal a couple of days ago. Pt endorses chest tightness.

## 2019-07-20 NOTE — ED Provider Notes (Signed)
[facility] EMERGENCY DEPARTMENT Provider Note   CSN: [id number] Arrival date & time: 07/20/19  1503     History Chief Complaint  Patient presents with  . Loss of Consciousness    [name] [name] is a 29 y.o. female with a past medical history significant for asthma, depression, seizure disorder not currently on any medication, and POTS who presents to the ED after a near syncopal episode that occurred 1 hour prior to arrival.  Episode was witnessed by a coworker Patty who is an EMT.  Spoke to [place] on the phone who noted that patient was awake during the entire episode, but not coherent. During the episode, patient was confused and unable to state her name or coworkers name. Patty denies any seizure activity. Patient notes the same thing happened on Tuesday and she was evaluated by her PCP who believed it was related to her POTS. Patient's BP was checked during the episode which was 160/110. Patient notes episode was preceded by palpitations. During each episode, patient was sitting prior to the near syncopal event. She admits to chest tightness right now. Denies shortness of breath and lower extremity edema. She was switched birth control pills on Sunday and is currently on Bl[place]ovi. Patient is not currently on any medication for her seizure disorder. Her last seizure was 1 month ago due to lack of sleep. Patient denies recent illness, fever, and chills. No aggravating or alleviating factors. No head injury during near syncopal event.  History obtained from patient and past medical records. No interpreter used during encounter.      Past Medical History:  Diagnosis Date  . Asthma   . Chest pain   . Depression   . Pott's disease   . Seizures [facility])     Patient Active Problem List   Diagnosis Date Noted  . Severe recurrent major depression without psychotic features ([facility]) 10/27/2016    Class: Chronic  . Nexplanon in place 05/14/2016  . Right thyroid nodule 01/19/2015    . POTS (postural orthostatic tachycardia syndrome) 11/23/2013  . Palpitations 11/23/2013  . Chest pain   . Nausea and vomiting 11/02/2011  . Vertigo 11/01/2011  . Labyrinthitis 11/01/2011  . Seizure disorder ([facility]) 11/01/2011  . Asthma 11/01/2011    Past Surgical History:  Procedure Laterality Date  . TONSILLECTOMY       OB History    Gravida  0   Para  0   Term  0   Preterm  0   AB  0   Living  0     SAB  0   TAB  0   Ectopic  0   Multiple  0   Live Births              Family History  Problem Relation Age of Onset  . Thyroid disease Brother        hashimoto's thyroiditis  . Alcohol abuse Mother   . Depression Mother   . Thyroid disease Sister   . Anxiety disorder Sister   . Depression Sister     Social History   Tobacco Use  . Smoking status: Never Smoker  . Smokeless tobacco: Never Used  Substance Use Topics  . Alcohol use: No    Alcohol/week: 0.0 standard drinks    Comment: rarely  . Drug use: No    Home Medications Prior to Admission medications   Medication Sig Start Date End Date Taking? Authorizing Provider  albuterol (PROVENTIL HFA;VENTOLIN HFA) 108 (  90 BASE) MCG/ACT inhaler Inhale into the lungs every 6 (six) hours as needed for wheezing or shortness of breath.   Yes [provider]  ARIPiprazole (ABILIFY) 2 MG tablet Take 2 mg by mouth daily. 06/21/19  Yes [provider]  BLISOVI 24 FE 1-20 MG-MCG(24) tablet Take 1 tablet by mouth daily. 07/04/19  Yes [provider]  cetirizine (ZYRTEC) 10 MG tablet Take 10 mg by mouth daily as needed for allergies.   Yes [provider]  FLUoxetine (PROZAC) 20 MG capsule Take 1 capsule (20 mg total) by mouth daily for 7 days, THEN 2 capsules (40 mg total) daily. Patient taking differently: 40 mg by mouth every morning 09/22/17 07/20/19 Yes Eksir, Bo Mcclintock, MD  DULoxetine (CYMBALTA) 30 MG capsule Take 1 capsule (30 mg total) by mouth daily for 7  doses. Patient not taking: Reported on 11/22/2017 09/22/17 09/29/17  Burnard Leigh, MD  pantoprazole (PROTONIX) 20 MG tablet Take 1 tablet (20 mg total) by mouth daily. Patient not taking: Reported on 07/20/2019 11/22/17 12/23/17  Doreene Eland, MD    Allergies    Penicillins and Amoxicillin  Review of Systems   Review of Systems  Constitutional: Negative for chills and fever.  Respiratory: Positive for chest tightness. Negative for shortness of breath.   Cardiovascular: Positive for chest pain and palpitations. Negative for leg swelling.  Gastrointestinal: Negative for abdominal pain, diarrhea, nausea and vomiting.  Neurological: Negative for headaches.  All other systems reviewed and are negative.   Physical Exam Updated Vital Signs BP 124/81   Pulse 74   Temp 99 F (37.2 C) (Oral)   Resp 20   Ht 5\' 4"  (1.626 m)   Wt 60.3 kg   SpO2 97%   BMI 22.83 kg/m   Physical Exam Vitals and nursing note reviewed.  Constitutional:      General: She is not in acute distress.    Appearance: She is not ill-appearing.  HENT:     Head: Normocephalic.  Eyes:     Pupils: Pupils are equal, round, and reactive to light.  Cardiovascular:     Rate and Rhythm: Normal rate and regular rhythm.     Pulses: Normal pulses.     Heart sounds: Normal heart sounds. No murmur. No friction rub. No gallop.   Pulmonary:     Effort: Pulmonary effort is normal.     Breath sounds: Normal breath sounds.  Abdominal:     General: Abdomen is flat. Bowel sounds are normal. There is no distension.     Palpations: Abdomen is soft.     Tenderness: There is no abdominal tenderness. There is no guarding or rebound.  Musculoskeletal:     Cervical back: Neck supple.     Comments: No lower extremity edema. No calf tenderness bilaterally. Negative homan sign bilaterally.   Skin:    General: Skin is warm and dry.  Neurological:     General: No focal deficit present.     Mental Status: She is alert.      Comments: Speech is clear, able to follow commands CN III-XII intact Normal strength in upper and lower extremities bilaterally including dorsiflexion and plantar flexion, strong and equal grip strength Sensation grossly intact throughout Moves extremities without ataxia, coordination intact No pronator drift Ambulates without difficulty  Psychiatric:        Mood and Affect: Mood normal.        Behavior: Behavior normal.     ED Results / Procedures /  Treatments   Labs (all labs ordered are listed, but only abnormal results are displayed) Labs Reviewed  BASIC METABOLIC PANEL - Abnormal; Notable for the following components:      Result Value   Glucose, Bld 117 (*)    All other components within normal limits  URINALYSIS, ROUTINE W REFLEX MICROSCOPIC - Abnormal; Notable for the following components:   Leukocytes,Ua TRACE (*)    All other components within normal limits  CBG MONITORING, ED - Abnormal; Notable for the following components:   Glucose-Capillary 119 (*)    All other components within normal limits  URINE CULTURE  CBC  D-DIMER, QUANTITATIVE (NOT AT [facility])  I-STAT BETA HCG BLOOD, ED ([facility], [facility], [facility] ONLY)  TROPONIN I (HIGH SENSITIVITY)  TROPONIN I (HIGH SENSITIVITY)    EKG EKG Interpretation  Date/Time:  Thursday Jul 20 2019 15:39:31 EDT Ventricular Rate:  86 PR Interval:  112 QRS Duration: 83 QT Interval:  357 QTC Calculation: 427 R Axis:   58 Text Interpretation: Sinus rhythm Short PR interval Confirmed by Tilden Fossa [phone]) on 07/20/2019 3:50:21 PM   Radiology No results found.  Procedures Procedures (including critical care time)  Medications Ordered in ED Medications  sodium chloride flush (NS) 0.9 % injection 3 mL (has no administration in time range)    ED Course  I have reviewed the triage vital signs and the nursing notes.  Pertinent labs & imaging results that were available during my care of the patient were reviewed by me and considered  in my medical decision making (see chart for details).  Clinical Course as of Jul 19 1917  Thu Jul 20, 2019  1914 Troponin I (High Sensitivity): <2 [CA]    Clinical Course User Index [CA] Jesusita Oka   MDM Rules/Calculators/A&P                     29 year old female presents to the ED after a near syncopal episode that occurred 1 hour prior to arrival.  Patient had a similar episode on Tuesday of this week.  Patient has a history of POTS and seizure disorder not currently on any medication.  Near syncopal episode preceded by palpitations.  Upon arrival, vitals all within normal limits.  Patient in no acute distress and nontoxic-appearing.  Physical exam reassuring.  Normal neurological exam.  Will obtain routine labs to rule out acute abnormalities, troponin given patient is having chest tightness, and d-dimer to rule out PE due to syncopal event and patient being on birth control. Suspect symptoms related to POTS.   EKG personally reviewed which demonstrates normal sinus rhythm with no signs of acute ischemia.  BMP reassuring with mild hyperglycemia at 117, but otherwise unremarkable.  No anion gap.  No concern for DKA.  Negative pregnancy test.  Initial troponin normal.  Will obtain delta troponin to rule out ACS.  UA significant for trace leukocytes.  We will hold antibiotics given patient is asymptomatic at this time.  Urine culture pending.  Delta troponin flat. Doubt ACS with normal troponin and EKG. Low suspicione for PE given normal d-dimer. Suspect symptoms related to POTS. Advised patient to follow-up with her cardiologist and neurologist for further evaluation. Strict ED precautions discussed with patient. Patient states understanding and agrees to plan. Patient discharged home in no acute distress and stable vitals  Final Clinical Impression(s) / ED Diagnoses Final diagnoses:  Near syncope    Rx / DC Orders ED Discharge Orders    None  Karie Kirks 07/20/19 1921    Quintella Reichert, MD 07/21/19 1214

## 2019-07-20 NOTE — Discharge Instructions (Addendum)
As discussed, all of your labs were reassuring today.  Your cardiac marker and marker for a blood clot were negative.  Please follow-up with your cardiologist and neurologist for further evaluation.  I suspect your symptoms are related to POTS.  Return to the ER for new or worsening symptoms.  I have included the number for [facility] cardiology. Please call for further evaluation.

## 2019-07-21 ENCOUNTER — Encounter: Payer: Self-pay | Admitting: Neurology

## 2019-07-22 LAB — URINE CULTURE: Culture: NO GROWTH

## 2019-07-23 NOTE — Progress Notes (Signed)
Cardiology Office Note:   Date:  07/25/2019  NAME:  [name] [name]    MRN: [id number] DOB:  23-Nov-1990   PCP:  Wilfrid Lund, PA  Cardiologist:  No primary care provider on file.   Referring MD: Wilfrid Lund, PA   Chief Complaint  Patient presents with  . Dizziness   History of Present Illness:   [name] [name] is a 29 y.o. female with a hx of depression who is being seen today for the evaluation of dizziness at the request of Wilfrid Lund, [place]. Recent evaluation in the ER for near-syncope. Troponin negative. EKG unremarkable.  She reports she has had 2 episodes of passing out.  She reports Tuesday of last week around 10 AM she was at work.  She was sitting at her desk.  She felt a sudden sensation of her heart racing while sitting.  Associated symptoms include shortness of breath as well as tunnel vision.  She reports that she passed out.  She reports waking up with her coworkers above her asking her to wake up.  Apparently she came to very quickly and did not have any urination or defecation.  She reports no tongue biting and there was no seizure-like activity reported.  A second event occurred similarly on Thursday at work.  Similar episodes of heart racing.  She reports that the EMTs arrived and her blood pressure was high.  She apparently again came to very quickly and denied any symptoms of chest pain before or after the event.  Again there was no seizure-like activity reported.  There is no tongue biting or urination or defecation reported.  She does have a longstanding history of POTS but it appears these episodes occurred while she was sitting.  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  She was recently started on a different birth control.  And takes Prozac.  She also uses albuterol as needed.  Apparently her POTS has not been a problem for her lately.  She does report intermittent lower extremity edema but on exam today there is none.  She does not  consume excess caffeine.  No alcohol use reported.  She does not smoke.  There is no strong family history of heart disease reported.  She reports no limitations with regular activity.  She denies any chest pain or shortness of breath when walking normally.  She has no palpitations.  These 2 episodes of bother her and she actually went to the emergency room for one of the episodes.  There her work-up was largely unremarkable.  She was then instructed follow-up with [place] today.  Past Medical History: Past Medical History:  Diagnosis Date  . Asthma   . Chest pain   . Depression   . Pott's disease   . Seizures (HCC)     Past Surgical History: Past Surgical History:  Procedure Laterality Date  . TONSILLECTOMY      Current Medications: Current Meds  Medication Sig  . albuterol (PROVENTIL HFA;VENTOLIN HFA) 108 (90 BASE) MCG/ACT inhaler Inhale into the lungs every 6 (six) hours as needed for wheezing or shortness of breath.  Marland Kitchen BLISOVI 24 FE 1-20 MG-MCG(24) tablet Take 1 tablet by mouth daily.  Marland Kitchen FLUoxetine (PROZAC) 20 MG capsule Take 20 mg by mouth 2 (two) times daily.  . [DISCONTINUED] FLUoxetine (PROZAC) 20 MG capsule Take 1 capsule (20 mg total) by mouth daily for 7 days, THEN 2 capsules (40 mg total)  daily. (Patient taking differently: 40 mg by mouth every morning)     Allergies:    Penicillins and Amoxicillin   Social History: Social History   Socioeconomic History  . Marital status: Single    Spouse name: Not on file  . Number of children: Not on file  . Years of education: Not on file  . Highest education level: Not on file  Occupational History  . Not on file  Tobacco Use  . Smoking status: Never Smoker  . Smokeless tobacco: Never Used  Substance and Sexual Activity  . Alcohol use: No    Alcohol/week: 0.0 standard drinks    Comment: rarely  . Drug use: No  . Sexual activity: Never    Birth control/protection: Implant, Inserts    Comment: NEXPLANON and Nuva Ring    Other Topics Concern  . Not on file  Social History Narrative  . Not on file   Social Determinants of Health   Financial Resource Strain:   . Difficulty of Paying Living Expenses:   Food Insecurity:   . Worried About Charity fundraiser in the Last Year:   . Arboriculturist in the Last Year:   Transportation Needs:   . Film/video editor (Medical):   Marland Kitchen Lack of Transportation (Non-Medical):   Physical Activity:   . Days of Exercise per Week:   . Minutes of Exercise per Session:   Stress:   . Feeling of Stress :   Social Connections:   . Frequency of Communication with Friends and Family:   . Frequency of Social Gatherings with Friends and Family:   . Attends Religious Services:   . Active Member of Clubs or Organizations:   . Attends Archivist Meetings:   Marland Kitchen Marital Status:      Family History: The patient's family history includes Anxiety disorder in her sister; Depression in her sister; Hypertension in her mother; Thyroid disease in her brother and sister.  ROS:   All other ROS reviewed and negative. Pertinent positives noted in the HPI.     EKGs/Labs/Other Studies Reviewed:   The following studies were personally reviewed by me today:  EKG:  EKG is ordered today.  The ekg ordered today demonstrates normal sinus rhythm, heart rate 92, no acute ST-T changes, no evidence of prior infarction al and was personally reviewed by me.   Recent Labs: 07/20/2019: BUN 15; Creatinine, Ser 0.91; Hemoglobin 13.4; Platelets 296; Potassium 3.8; Sodium 136   Recent Lipid Panel No results found for: CHOL, TRIG, HDL, CHOLHDL, VLDL, LDLCALC, LDLDIRECT  Physical Exam:   VS:  BP 120/72   Pulse 92   Ht 5\' 4"  (1.626 m)   Wt 136 lb 6.4 oz (61.9 kg)   SpO2 98%   BMI 23.41 kg/m    Wt Readings from Last 3 Encounters:  07/25/19 136 lb 6.4 oz (61.9 kg)  07/20/19 133 lb (60.3 kg)  10/20/16 120 lb (54.4 kg)    General: Well nourished, well developed, in no acute  distress Heart: Atraumatic, normal size  Eyes: PEERLA, EOMI  Neck: Supple, no JVD Endocrine: No thryomegaly Cardiac: Normal S1, S2; RRR; no murmurs, rubs, or gallops Lungs: Clear to auscultation bilaterally, no wheezing, rhonchi or rales  Abd: Soft, nontender, no hepatomegaly  Ext: No edema, pulses 2+ Musculoskeletal: No deformities, BUE and BLE strength normal and equal Skin: Warm and dry, no rashes   Neuro: Alert and oriented to person, place, time, and situation, CNII-XII grossly intact,  no focal deficits  Psych: Normal mood and affect   ASSESSMENT:   RAEANA BLINN is a 29 y.o. female who presents for the following: 1. Dizziness   2. Palpitations   3. POTS (postural orthostatic tachycardia syndrome)     PLAN:   1. Dizziness 2. Palpitations -2 episodes of dizziness and palpitations with passing out spells.  Both episodes are not associated with a prodrome of chest pain.  She has no seizure-like activity reported.  There is no urination or defecation reported.  I do not think this was a seizure.  Her EKG today demonstrates normal sinus rhythm with nonspecific ST-T changes.  Given that she is had recurrent episodes and seems to come to relatively quickly within a few minutes, I highly doubt this is cardiac etiology.  She has been started on a new birth control and have asked her to stop this.  Possibly this could be creating an issue for her.  We will see if this helps.  We will proceed with a 7-day Zio patch to exclude any significant arrhythmia.  This will be important.  Her cardiovascular examination today is without any murmurs rubs or gallops.  She has no stigmata of heart disease such as lower extremity edema.  Her birth control agent is associated with edema and I think this will help as well.  She will also let us know if she has no further episodes with cessation of her birth control.  She is too young for obstructive CAD.  She also has no symptoms of this.  We will plan to see her  back in 2 months after above work-up.  3. POTS (postural orthostatic tachycardia syndrome) -Does not seem to be pots related.  Symptoms have occurred while sitting.  Apparently her POTS is been under good control.  She will continue with adequate hydration.  I also informed her that she will need to get on the ground she does have symptoms where she will like she will pass out.  This will help prevent her from passing out.   Disposition: Return in about 2 months (around 09/24/2019).  Medication Adjustments/Labs and Tests Ordered: Current medicines are reviewed at length with the patient today.  Concerns regarding medicines are outlined above.  Orders Placed This Encounter  Procedures  . LONG TERM MONITOR (3-14 DAYS)  . EKG 12-Lead   No orders of the defined types were placed in this encounter.   Patient Instructions  Medication Instructions:  The current medical regimen is effective;  continue present plan and medications.  *If you need a refill on your cardiac medications before your next appointment, please call your pharmacy*   Testing/Procedures: Your physician has recommended that you wear a 7 DAY ZIO-PATCH monitor. The Zio patch cardiac monitor continuously records heart rhythm data for up to 14 days, this is for patients being evaluated for multiple types heart rhythms. For the first 24 hours post application, please avoid getting the Zio monitor wet in the shower or by excessive sweating during exercise. After that, feel free to carry on with regular activities. Keep soaps and lotions away from the ZIO XT Patch.  This will be mailed to you, please expect 7-10 days to receive.         Follow-Up: At [facility], you and your health needs are our priority.  As part of our continuing mission to provide you with exceptional heart care, we have created designated Provider Care Teams.  These Care Teams include your primary Cardiologist (  physician) and Advanced Practice Providers  (APPs -  Physician Assistants and Nurse Practitioners) who all work together to provide you with the care you need, when you need it.  We recommend signing up for the patient portal called "MyChart".  Sign up information is provided on this After Visit Summary.  MyChart is used to connect with patients for Virtual Visits (Telemedicine).  Patients are able to view lab/test results, encounter notes, upcoming appointments, etc.  Non-urgent messages can be sent to your provider as well.   To learn more about what you can do with MyChart, go to [email or web address].    Your next appointment:   2 month(s)  The format for your next appointment:   In Person  Provider:   Lennie Odor, MD        Signed, Lenna Gilford. Flora Lipps, MD [facility]  [address] [place], [place] [address] [phone]  07/25/2019 5:05 PM

## 2019-07-25 ENCOUNTER — Encounter: Payer: Self-pay | Admitting: Cardiovascular Disease

## 2019-07-25 ENCOUNTER — Ambulatory Visit (INDEPENDENT_AMBULATORY_CARE_PROVIDER_SITE_OTHER): Payer: 59 | Admitting: Cardiovascular Disease

## 2019-07-25 ENCOUNTER — Other Ambulatory Visit: Payer: Self-pay

## 2019-07-25 VITALS — BP 120/72 | HR 92 | Ht 64.0 in | Wt 136.4 lb

## 2019-07-25 DIAGNOSIS — R42 Dizziness and giddiness: Secondary | ICD-10-CM

## 2019-07-25 DIAGNOSIS — I498 Other specified cardiac arrhythmias: Secondary | ICD-10-CM

## 2019-07-25 DIAGNOSIS — R002 Palpitations: Secondary | ICD-10-CM | POA: Diagnosis not present

## 2019-07-25 DIAGNOSIS — G90A Postural orthostatic tachycardia syndrome (POTS): Secondary | ICD-10-CM

## 2019-07-25 NOTE — Patient Instructions (Signed)
Medication Instructions:  The current medical regimen is effective;  continue present plan and medications.  *If you need a refill on your cardiac medications before your next appointment, please call your pharmacy*   Testing/Procedures: Your physician has recommended that you wear a 7 DAY ZIO-PATCH monitor. The Zio patch cardiac monitor continuously records heart rhythm data for up to 14 days, this is for patients being evaluated for multiple types heart rhythms. For the first 24 hours post application, please avoid getting the Zio monitor wet in the shower or by excessive sweating during exercise. After that, feel free to carry on with regular activities. Keep soaps and lotions away from the ZIO XT Patch.  This will be mailed to you, please expect 7-10 days to receive.         Follow-Up: At [facility], you and your health needs are our priority.  As part of our continuing mission to provide you with exceptional heart care, we have created designated Provider Care Teams.  These Care Teams include your primary Cardiologist (physician) and Advanced Practice Providers (APPs -  Physician Assistants and Nurse Practitioners) who all work together to provide you with the care you need, when you need it.  We recommend signing up for the patient portal called "MyChart".  Sign up information is provided on this After Visit Summary.  MyChart is used to connect with patients for Virtual Visits (Telemedicine).  Patients are able to view lab/test results, encounter notes, upcoming appointments, etc.  Non-urgent messages can be sent to your provider as well.   To learn more about what you can do with MyChart, go to [email or web address].    Your next appointment:   2 month(s)  The format for your next appointment:   In Person  Provider:   Lennie Odor, MD

## 2019-07-27 DIAGNOSIS — Z309 Encounter for contraceptive management, unspecified: Secondary | ICD-10-CM | POA: Diagnosis not present

## 2019-07-29 DIAGNOSIS — F411 Generalized anxiety disorder: Secondary | ICD-10-CM | POA: Diagnosis not present

## 2019-07-29 DIAGNOSIS — F331 Major depressive disorder, recurrent, moderate: Secondary | ICD-10-CM | POA: Diagnosis not present

## 2019-08-01 ENCOUNTER — Ambulatory Visit (INDEPENDENT_AMBULATORY_CARE_PROVIDER_SITE_OTHER): Payer: 59

## 2019-08-01 DIAGNOSIS — R42 Dizziness and giddiness: Secondary | ICD-10-CM

## 2019-08-01 DIAGNOSIS — R002 Palpitations: Secondary | ICD-10-CM | POA: Diagnosis not present

## 2019-08-25 DIAGNOSIS — R42 Dizziness and giddiness: Secondary | ICD-10-CM | POA: Diagnosis not present

## 2019-08-25 DIAGNOSIS — R002 Palpitations: Secondary | ICD-10-CM | POA: Diagnosis not present

## 2019-08-26 DIAGNOSIS — R4020 Unspecified coma: Secondary | ICD-10-CM | POA: Diagnosis not present

## 2019-08-26 DIAGNOSIS — R069 Unspecified abnormalities of breathing: Secondary | ICD-10-CM | POA: Diagnosis not present

## 2019-08-26 DIAGNOSIS — R404 Transient alteration of awareness: Secondary | ICD-10-CM | POA: Diagnosis not present

## 2019-08-26 DIAGNOSIS — I498 Other specified cardiac arrhythmias: Secondary | ICD-10-CM | POA: Diagnosis not present

## 2019-08-26 DIAGNOSIS — R0602 Shortness of breath: Secondary | ICD-10-CM | POA: Diagnosis not present

## 2019-08-26 DIAGNOSIS — R079 Chest pain, unspecified: Secondary | ICD-10-CM | POA: Diagnosis not present

## 2019-08-26 DIAGNOSIS — R457 State of emotional shock and stress, unspecified: Secondary | ICD-10-CM | POA: Diagnosis not present

## 2019-08-26 DIAGNOSIS — R0789 Other chest pain: Secondary | ICD-10-CM | POA: Diagnosis not present

## 2019-08-26 DIAGNOSIS — Z79899 Other long term (current) drug therapy: Secondary | ICD-10-CM | POA: Diagnosis not present

## 2019-08-26 DIAGNOSIS — R55 Syncope and collapse: Secondary | ICD-10-CM | POA: Diagnosis not present

## 2019-08-27 DIAGNOSIS — R55 Syncope and collapse: Secondary | ICD-10-CM | POA: Diagnosis not present

## 2019-08-27 DIAGNOSIS — Z79899 Other long term (current) drug therapy: Secondary | ICD-10-CM | POA: Diagnosis not present

## 2019-08-27 DIAGNOSIS — I498 Other specified cardiac arrhythmias: Secondary | ICD-10-CM | POA: Diagnosis not present

## 2019-08-27 DIAGNOSIS — R0602 Shortness of breath: Secondary | ICD-10-CM | POA: Diagnosis not present

## 2019-08-28 ENCOUNTER — Other Ambulatory Visit: Payer: Self-pay | Admitting: Cardiovascular Disease

## 2019-08-28 ENCOUNTER — Other Ambulatory Visit: Payer: Self-pay | Admitting: *Deleted

## 2019-08-28 DIAGNOSIS — R002 Palpitations: Secondary | ICD-10-CM

## 2019-08-28 DIAGNOSIS — R42 Dizziness and giddiness: Secondary | ICD-10-CM

## 2019-09-24 NOTE — Progress Notes (Signed)
Cardiology Office Note:   Date:  09/25/2019  NAME:  [name] [name]    MRN: [id number] DOB:  1990/12/18   PCP:  Wilfrid Lund, PA  Cardiologist:  Reatha Harps, MD   Referring MD: Wilfrid Lund, [place]   Chief Complaint  Patient presents with  . Follow-up   History of Present Illness:   [name] [name] is a 29 y.o. female with a hx of POTS who presents for follow-up of palpitations. Recent monitor normal. She was started on a new birth control medication and we stopped this. She presents for follow-up. Her EKG and monitor were normal.   She reports since her last visit she said 2 more episodes of passing out.  He reports that while at work and standing in the afternoon 1 day she developed tunnel vision.  She reports she became nauseated and sweaty.  She reports her heart rate went up into the 130s.  She reports that she then got on the ground and then blacked out for a few minutes.  She apparently came to very quickly.  She describes no chest pain or shortness of breath prior to the episode.  There is no urination or defecation.  She reports that she did not have any seizure-like activity.  She had another episode also at work while walking.  Similar description was described.  She comes to quickly after the events and has no long-lasting consequences or sequelae.  She did stop the recent birth control but has noticed no improvement.  She apparently was on Florinef in the past when she saw Dr. Donato Schultz in 2016 but has come off this medication.  She reports her POTS symptoms are there but she does treat this conservatively with hydration.  She did have an echocardiogram in 2016 that was normal.  We did discuss options such as propranolol and she is willing to try this.  Ivabradine would be a good option as well.  I did describe that she does need to increase her hydration as well as to be aware that she can have these vasovagal episodes.  Past Medical History: Past Medical History:  Diagnosis  Date  . Asthma   . Chest pain   . Depression   . Pott's disease   . Seizures (HCC)     Past Surgical History: Past Surgical History:  Procedure Laterality Date  . TONSILLECTOMY      Current Medications: Current Meds  Medication Sig  . albuterol (PROVENTIL HFA;VENTOLIN HFA) 108 (90 BASE) MCG/ACT inhaler Inhale into the lungs every 6 (six) hours as needed for wheezing or shortness of breath.  Marland Kitchen FLUoxetine (PROZAC) 20 MG capsule Take 20 mg by mouth 2 (two) times daily.  . [DISCONTINUED] BLISOVI 24 FE 1-20 MG-MCG(24) tablet Take 1 tablet by mouth daily.     Allergies:    Penicillins and Amoxicillin   Social History: Social History   Socioeconomic History  . Marital status: Single    Spouse name: Not on file  . Number of children: Not on file  . Years of education: Not on file  . Highest education level: Not on file  Occupational History  . Not on file  Tobacco Use  . Smoking status: Never Smoker  . Smokeless tobacco: Never Used  Vaping Use  . Vaping Use: Never used  Substance and Sexual Activity  . Alcohol use: No    Alcohol/week: 0.0 standard drinks    Comment: rarely  . Drug use: No  .  Sexual activity: Never    Birth control/protection: Implant, Inserts    Comment: NEXPLANON and Nuva Ring  Other Topics Concern  . Not on file  Social History Narrative  . Not on file   Social Determinants of Health   Financial Resource Strain:   . Difficulty of Paying Living Expenses:   Food Insecurity:   . Worried About Programme researcher, broadcasting/film/video in the Last Year:   . Barista in the Last Year:   Transportation Needs:   . Freight forwarder (Medical):   Marland Kitchen Lack of Transportation (Non-Medical):   Physical Activity:   . Days of Exercise per Week:   . Minutes of Exercise per Session:   Stress:   . Feeling of Stress :   Social Connections:   . Frequency of Communication with Friends and Family:   . Frequency of Social Gatherings with Friends and Family:   . Attends  Religious Services:   . Active Member of Clubs or Organizations:   . Attends Banker Meetings:   Marland Kitchen Marital Status:      Family History: The patient's family history includes Anxiety disorder in her sister; Depression in her sister; Hypertension in her mother; Thyroid disease in her brother and sister.  ROS:   All other ROS reviewed and negative. Pertinent positives noted in the HPI.     EKGs/Labs/Other Studies Reviewed:   The following studies were personally reviewed by me today:  Zio 08/01/2019  Impression:  1. No significant arrhythmias detected.  2. Rare ectopy.    Recent Labs: 07/20/2019: BUN 15; Creatinine, Ser 0.91; Hemoglobin 13.4; Platelets 296; Potassium 3.8; Sodium 136   Recent Lipid Panel No results found for: CHOL, TRIG, HDL, CHOLHDL, VLDL, LDLCALC, LDLDIRECT  Physical Exam:   VS:  BP 116/80 (BP Location: Left Arm, Patient Position: Sitting, Cuff Size: Normal)   Pulse 75   Ht 5\' 4"  (1.626 m)   Wt 135 lb 9.6 oz (61.5 kg)   SpO2 99%   BMI 23.28 kg/m    Wt Readings from Last 3 Encounters:  09/25/19 135 lb 9.6 oz (61.5 kg)  07/25/19 136 lb 6.4 oz (61.9 kg)  07/20/19 133 lb (60.3 kg)    General: Well nourished, well developed, in no acute distress Heart: Atraumatic, normal size  Eyes: PEERLA, EOMI  Neck: Supple, no JVD Endocrine: No thryomegaly Cardiac: Normal S1, S2; RRR; no murmurs, rubs, or gallops Lungs: Clear to auscultation bilaterally, no wheezing, rhonchi or rales  Abd: Soft, nontender, no hepatomegaly  Ext: No edema, pulses 2+ Musculoskeletal: No deformities, BUE and BLE strength normal and equal Skin: Warm and dry, no rashes   Neuro: Alert and oriented to person, place, time, and situation, CNII-XII grossly intact, no focal deficits  Psych: Normal mood and affect   ASSESSMENT:   [name] [name] is a 29 y.o. female who presents for the following: 1. Dizziness   2. Palpitations   3. Vasovagal syncope   4. POTS (postural orthostatic  tachycardia syndrome)     PLAN:   1. Dizziness 2. Palpitations 3. Vasovagal syncope -She appears to have recurrent episodes of vasovagal syncope.  She has classic symptoms of lightheadedness and blurry vision.  She does report her heart is racing but this is a secondary mechanism.  Her monitor showed no evidence of arrhythmia.  Her EKG was normal.  She did have an echocardiogram in 2016 that was normal.  Her cardiovascular exam is normal.  I have instructed her  to maintain adequate hydration as well as to be aware that she can have the symptoms.  If she does she should sit on the ground and/or lay down.  This will help prevent any passing out.  Given that she has had recurrent episodes with minimal long-term issues this is likely less a primary cardiac etiology more likely a vasovagal episode.  This is most definitely related to her POTS.  4. POTS (postural orthostatic tachycardia syndrome) -She is maintaining adequate hydration.  We did discuss supine exercise.  I would also like to start her on propranolol 10 mg twice daily to see if this can reduce her symptoms.  She will increase her salt intake as well.  If we need to try ivabradine we will.  I will see her back in 6 months to reassess symptoms.  Disposition: Return in about 6 months (around 03/27/2020).  Medication Adjustments/Labs and Tests Ordered: Current medicines are reviewed at length with the patient today.  Concerns regarding medicines are outlined above.  No orders of the defined types were placed in this encounter.  Meds ordered this encounter  Medications  . propranolol (INDERAL) 10 MG tablet    Sig: Take 1 tablet (10 mg total) by mouth 2 (two) times daily.    Dispense:  180 tablet    Refill:  1    Patient Instructions  Medication Instructions:  Start Propranolol 10 mg twice daily   *If you need a refill on your cardiac medications before your next appointment, please call your pharmacy*   Follow-Up: At [facility], you and your health needs are our priority.  As part of our continuing mission to provide you with exceptional heart care, we have created designated Provider Care Teams.  These Care Teams include your primary Cardiologist (physician) and Advanced Practice Providers (APPs -  Physician Assistants and Nurse Practitioners) who all work together to provide you with the care you need, when you need it.  We recommend signing up for the patient portal called "MyChart".  Sign up information is provided on this After Visit Summary.  MyChart is used to connect with patients for Virtual Visits (Telemedicine).  Patients are able to view lab/test results, encounter notes, upcoming appointments, etc.  Non-urgent messages can be sent to your provider as well.   To learn more about what you can do with MyChart, go to [email or web address].    Your next appointment:   6 month(s)  The format for your next appointment:   In Person  Provider:   Lennie Odor, MD         Time Spent with Patient: I have spent a total of 25 minutes with patient reviewing hospital notes, telemetry, EKGs, labs and examining the patient as well as establishing an assessment and plan that was discussed with the patient.  > 50% of time was spent in direct patient care.  Signed, Lenna Gilford. Flora Lipps, MD [facility]  [address] [place], [place] [address] [phone]  09/25/2019 8:59 AM

## 2019-09-25 ENCOUNTER — Other Ambulatory Visit: Payer: Self-pay

## 2019-09-25 ENCOUNTER — Encounter: Payer: Self-pay | Admitting: Cardiovascular Disease

## 2019-09-25 ENCOUNTER — Ambulatory Visit (INDEPENDENT_AMBULATORY_CARE_PROVIDER_SITE_OTHER): Payer: 59 | Admitting: Cardiovascular Disease

## 2019-09-25 VITALS — BP 116/80 | HR 75 | Ht 64.0 in | Wt 135.6 lb

## 2019-09-25 DIAGNOSIS — I498 Other specified cardiac arrhythmias: Secondary | ICD-10-CM

## 2019-09-25 DIAGNOSIS — G90A Postural orthostatic tachycardia syndrome (POTS): Secondary | ICD-10-CM

## 2019-09-25 DIAGNOSIS — R002 Palpitations: Secondary | ICD-10-CM

## 2019-09-25 DIAGNOSIS — R42 Dizziness and giddiness: Secondary | ICD-10-CM | POA: Diagnosis not present

## 2019-09-25 DIAGNOSIS — R55 Syncope and collapse: Secondary | ICD-10-CM | POA: Diagnosis not present

## 2019-09-25 MED ORDER — PROPRANOLOL HCL 10 MG PO TABS: 10.0000 mg | ORAL_TABLET | Freq: Two times a day (BID) | ORAL | 1 refills | Status: DC

## 2019-09-25 NOTE — Patient Instructions (Signed)
Medication Instructions:  Start Propranolol 10 mg twice daily   *If you need a refill on your cardiac medications before your next appointment, please call your pharmacy*   Follow-Up: At [facility], you and your health needs are our priority.  As part of our continuing mission to provide you with exceptional heart care, we have created designated Provider Care Teams.  These Care Teams include your primary Cardiologist (physician) and Advanced Practice Providers (APPs -  Physician Assistants and Nurse Practitioners) who all work together to provide you with the care you need, when you need it.  We recommend signing up for the patient portal called "MyChart".  Sign up information is provided on this After Visit Summary.  MyChart is used to connect with patients for Virtual Visits (Telemedicine).  Patients are able to view lab/test results, encounter notes, upcoming appointments, etc.  Non-urgent messages can be sent to your provider as well.   To learn more about what you can do with MyChart, go to [email or web address].    Your next appointment:   6 month(s)  The format for your next appointment:   In Person  Provider:   [place] O'Neal, MD    

## 2019-10-19 ENCOUNTER — Ambulatory Visit: Payer: 59 | Admitting: Neurology

## 2019-10-23 DIAGNOSIS — F411 Generalized anxiety disorder: Secondary | ICD-10-CM | POA: Diagnosis not present

## 2019-10-23 DIAGNOSIS — F331 Major depressive disorder, recurrent, moderate: Secondary | ICD-10-CM | POA: Diagnosis not present

## 2019-11-12 DIAGNOSIS — R55 Syncope and collapse: Secondary | ICD-10-CM | POA: Diagnosis not present

## 2019-12-05 ENCOUNTER — Other Ambulatory Visit (HOSPITAL_COMMUNITY): Payer: Self-pay | Admitting: Family Medicine

## 2019-12-05 DIAGNOSIS — J4521 Mild intermittent asthma with (acute) exacerbation: Secondary | ICD-10-CM | POA: Diagnosis not present

## 2019-12-21 ENCOUNTER — Ambulatory Visit (INDEPENDENT_AMBULATORY_CARE_PROVIDER_SITE_OTHER): Payer: 59 | Admitting: Cardiovascular Disease

## 2019-12-21 ENCOUNTER — Other Ambulatory Visit: Payer: Self-pay

## 2019-12-21 ENCOUNTER — Other Ambulatory Visit: Payer: Self-pay | Admitting: Cardiovascular Disease

## 2019-12-21 ENCOUNTER — Encounter: Payer: Self-pay | Admitting: Cardiovascular Disease

## 2019-12-21 VITALS — BP 105/67 | HR 54 | Ht 64.0 in | Wt 136.0 lb

## 2019-12-21 DIAGNOSIS — G90A Postural orthostatic tachycardia syndrome (POTS): Secondary | ICD-10-CM

## 2019-12-21 DIAGNOSIS — I498 Other specified cardiac arrhythmias: Secondary | ICD-10-CM | POA: Diagnosis not present

## 2019-12-21 MED ORDER — IVABRADINE HCL 5 MG PO TABS: 5.0000 mg | ORAL_TABLET | Freq: Two times a day (BID) | ORAL | 3 refills | Status: DC

## 2019-12-21 NOTE — Patient Instructions (Signed)
Medication Instructions:  STOP propanolol START ivabradine (Corlanor) 5 mg two times daily  *If you need a refill on your cardiac medications before your next appointment, please call your pharmacy*  Follow-Up: At [facility], you and your health needs are our priority.  As part of our continuing mission to provide you with exceptional heart care, we have created designated Provider Care Teams.  These Care Teams include your primary Cardiologist (physician) and Advanced Practice Providers (APPs -  Physician Assistants and Nurse Practitioners) who all work together to provide you with the care you need, when you need it.  We recommend signing up for the patient portal called "MyChart".  Sign up information is provided on this After Visit Summary.  MyChart is used to connect with patients for Virtual Visits (Telemedicine).  Patients are able to view lab/test results, encounter notes, upcoming appointments, etc.  Non-urgent messages can be sent to your provider as well.   To learn more about what you can do with MyChart, go to [email or web address].    Your next appointment:   6 week(s)  The format for your next appointment:   In Person  Provider:   Lennie Odor, MD

## 2019-12-21 NOTE — Progress Notes (Signed)
Cardiology Office Note:   Date:  12/21/2019  NAME:  [name] [name]    MRN: [id number] DOB:  1991/02/07   PCP:  Wilfrid Lund, PA  Cardiologist:  Reatha Harps, MD   Referring MD: Wilfrid Lund, [place]   Chief Complaint  Patient presents with  . Dizziness   History of Present Illness:   [name] [name] is a 29 y.o. female with a hx of POTS who presents for follow-up.  She reports over the last 2 weeks she has had increased episodes of tiredness and fatigue.  She also yesterday had an episode of dizziness.  She apparently had a syncopal episode.  No prodrome of symptoms.  She is had similar episodes in the past.  This was attributed to possible her new birth control agent.  She reports is not under any significant stress.  Her heart rate is persistently in the 50s.  She is propanolol.  Her pots symptoms have been well controlled until recently.  Unclear what the trigger was.  She is try to do more exercise on a stationary bike or rowing.  She reports that when she stands up for has any prolonged standing she can feel her heart racing fast to get dizzy.  She does sit down and is circumvents any passing out spells.  Her blood pressure is quite low 105/67.  We did discuss switching to ivabradine.  I think this would be a good idea for her.   Past Medical History: Past Medical History:  Diagnosis Date  . Asthma   . Chest pain   . Depression   . Pott's disease   . Seizures (HCC)     Past Surgical History: Past Surgical History:  Procedure Laterality Date  . TONSILLECTOMY      Current Medications: Current Meds  Medication Sig  . albuterol (PROVENTIL HFA;VENTOLIN HFA) 108 (90 BASE) MCG/ACT inhaler Inhale into the lungs every 6 (six) hours as needed for wheezing or shortness of breath.  Marland Kitchen FLUoxetine (PROZAC) 20 MG capsule Take 20 mg by mouth 2 (two) times daily.  . [DISCONTINUED] propranolol (INDERAL) 10 MG tablet Take 1 tablet (10 mg total) by mouth 2 (two) times daily.      Allergies:    Penicillins and Amoxicillin   Social History: Social History   Socioeconomic History  . Marital status: Single    Spouse name: Not on file  . Number of children: Not on file  . Years of education: Not on file  . Highest education level: Not on file  Occupational History  . Not on file  Tobacco Use  . Smoking status: Never Smoker  . Smokeless tobacco: Never Used  Vaping Use  . Vaping Use: Never used  Substance and Sexual Activity  . Alcohol use: No    Alcohol/week: 0.0 standard drinks    Comment: rarely  . Drug use: No  . Sexual activity: Never    Birth control/protection: Implant, Inserts    Comment: NEXPLANON and Nuva Ring  Other Topics Concern  . Not on file  Social History Narrative  . Not on file   Social Determinants of Health   Financial Resource Strain:   . Difficulty of Paying Living Expenses: Not on file  Food Insecurity:   . Worried About Programme researcher, broadcasting/film/video in the Last Year: Not on file  . Ran Out of Food in the Last Year: Not on file  Transportation Needs:   . Lack of Transportation (Medical): Not on file  .  Lack of Transportation (Non-Medical): Not on file  Physical Activity:   . Days of Exercise per Week: Not on file  . Minutes of Exercise per Session: Not on file  Stress:   . Feeling of Stress : Not on file  Social Connections:   . Frequency of Communication with Friends and Family: Not on file  . Frequency of Social Gatherings with Friends and Family: Not on file  . Attends Religious Services: Not on file  . Active Member of Clubs or Organizations: Not on file  . Attends Banker Meetings: Not on file  . Marital Status: Not on file     Family History: The patient's family history includes Anxiety disorder in her sister; Depression in her sister; Hypertension in her mother; Thyroid disease in her brother and sister.  ROS:   All other ROS reviewed and negative. Pertinent positives noted in the HPI.      EKGs/Labs/Other Studies Reviewed:   The following studies were personally reviewed by me today:  EKG:  EKG is ordered today.  The ekg ordered today demonstrates sinus bradycardia, heart rate 54, no acute ischemic changes, no evidence of prior infarction, and was personally reviewed by me.  The device to  Recent Labs: 07/20/2019: BUN 15; Creatinine, Ser 0.91; Hemoglobin 13.4; Platelets 296; Potassium 3.8; Sodium 136   Recent Lipid Panel No results found for: CHOL, TRIG, HDL, CHOLHDL, VLDL, LDLCALC, LDLDIRECT  Physical Exam:   VS:  BP 105/67   Pulse (!) 54   Ht 5\' 4"  (1.626 m)   Wt 136 lb (61.7 kg)   SpO2 98%   BMI 23.34 kg/m    Wt Readings from Last 3 Encounters:  12/21/19 136 lb (61.7 kg)  09/25/19 135 lb 9.6 oz (61.5 kg)  07/25/19 136 lb 6.4 oz (61.9 kg)    General: Well nourished, well developed, in no acute distress Heart: Atraumatic, normal size  Eyes: PEERLA, EOMI  Neck: Supple, no JVD Endocrine: No thryomegaly Cardiac: Normal S1, S2; RRR; no murmurs, rubs, or gallops Lungs: Clear to auscultation bilaterally, no wheezing, rhonchi or rales  Abd: Soft, nontender, no hepatomegaly  Ext: No edema, pulses 2+ Musculoskeletal: No deformities, BUE and BLE strength normal and equal Skin: Warm and dry, no rashes   Neuro: Alert and oriented to person, place, time, and situation, CNII-XII grossly intact, no focal deficits  Psych: Normal mood and affect   ASSESSMENT:   [name] [name] is a 29 y.o. female who presents for the following: 1. POTS (postural orthostatic tachycardia syndrome)     PLAN:   1. POTS (postural orthostatic tachycardia syndrome) -I suspect her symptoms are vasovagal syncope.  EKG is normal.  She is in normal monitor in the past.  She reports has had echocardiograms in the past that are normal.  I really hear nothing alarming concerning for serious cardiac pathology.  Symptoms could also be POTS related.  Her symptoms are getting worse.  She has started a new  relationship and possibly there is some stress here.  I recommended we switch her to ivabradine 5 mg twice daily.  She should increase her fluid intake as well as increase her salt intake.  This will help.  I also recommended supine exercise such as a stationary bike or rowing.  She will pursue this.  We will see her back in 6 weeks to reassess symptoms.  Disposition: Return in about 6 weeks (around 02/01/2020).  Medication Adjustments/Labs and Tests Ordered: Current medicines are reviewed at  length with the patient today.  Concerns regarding medicines are outlined above.  No orders of the defined types were placed in this encounter.  Meds ordered this encounter  Medications  . ivabradine (CORLANOR) 5 MG TABS tablet    Sig: Take 1 tablet (5 mg total) by mouth 2 (two) times daily with a meal.    Dispense:  60 tablet    Refill:  3    Patient Instructions  Medication Instructions:  STOP propanolol START ivabradine (Corlanor) 5 mg two times daily  *If you need a refill on your cardiac medications before your next appointment, please call your pharmacy*  Follow-Up: At [facility], you and your health needs are our priority.  As part of our continuing mission to provide you with exceptional heart care, we have created designated Provider Care Teams.  These Care Teams include your primary Cardiologist (physician) and Advanced Practice Providers (APPs -  Physician Assistants and Nurse Practitioners) who all work together to provide you with the care you need, when you need it.  We recommend signing up for the patient portal called "MyChart".  Sign up information is provided on this After Visit Summary.  MyChart is used to connect with patients for Virtual Visits (Telemedicine).  Patients are able to view lab/test results, encounter notes, upcoming appointments, etc.  Non-urgent messages can be sent to your provider as well.   To learn more about what you can do with MyChart, go to  [email or web address].    Your next appointment:   6 week(s)  The format for your next appointment:   In Person  Provider:   Lennie Odor, MD      Time Spent with Patient: I have spent a total of 25 minutes with patient reviewing hospital notes, telemetry, EKGs, labs and examining the patient as well as establishing an assessment and plan that was discussed with the patient.  > 50% of time was spent in direct patient care.  Signed, Lenna Gilford. Flora Lipps, MD [facility]  [address], [address] [place], [place] [address] [phone]  12/21/2019 4:31 PM

## 2019-12-22 ENCOUNTER — Telehealth: Payer: Self-pay

## 2019-12-22 NOTE — Telephone Encounter (Signed)
**Note De-identified  Obfuscation** -----  **Note De-Identified  Obfuscation** Message from Harvel Ricks, RN sent at 12/21/2019  4:06 PM EDT ----- Regarding: PA Corlanor PA needed for Corlanor-POTS  Dr. Donzetta Starch YOU!

## 2019-12-22 NOTE — Telephone Encounter (Signed)
**Note De-Identified  Obfuscation** I started a Corlanor PA through covermymeds: Key: [id number]

## 2020-01-03 ENCOUNTER — Other Ambulatory Visit (HOSPITAL_COMMUNITY): Payer: Self-pay | Admitting: Family Medicine

## 2020-01-03 DIAGNOSIS — F329 Major depressive disorder, single episode, unspecified: Secondary | ICD-10-CM | POA: Diagnosis not present

## 2020-01-03 DIAGNOSIS — R5383 Other fatigue: Secondary | ICD-10-CM | POA: Diagnosis not present

## 2020-01-03 DIAGNOSIS — Z03818 Encounter for observation for suspected exposure to other biological agents ruled out: Secondary | ICD-10-CM | POA: Diagnosis not present

## 2020-01-03 DIAGNOSIS — F418 Other specified anxiety disorders: Secondary | ICD-10-CM | POA: Diagnosis not present

## 2020-01-03 DIAGNOSIS — Z20822 Contact with and (suspected) exposure to covid-19: Secondary | ICD-10-CM | POA: Diagnosis not present

## 2020-01-18 DIAGNOSIS — R5383 Other fatigue: Secondary | ICD-10-CM | POA: Diagnosis not present

## 2020-01-31 NOTE — Progress Notes (Deleted)
Cardiology Office Note:   Date:  01/31/2020  NAME:  [name] [name]    MRN: [id number] DOB:  June 03, 1990   PCP:  [name] Lund, PA  Cardiologist:  Reatha Harps, MD  Electrophysiologist:  None   Referring MD: [name] Lund, PA   No chief complaint on file. ***  History of Present Illness:   [name] [name] is a 29 y.o. female with a hx of POTS who presents for follow-up. Seen several weeks ago with worsening dizziness and palpitations. Started on ivabradine.     Past Medical History: Past Medical History:  Diagnosis Date  . Asthma   . Chest pain   . Depression   . Pott's disease   . Seizures (HCC)     Past Surgical History: Past Surgical History:  Procedure Laterality Date  . TONSILLECTOMY      Current Medications: No outpatient medications have been marked as taking for the 02/02/20 encounter (Appointment) with O'Neal, Ronnald Ramp, MD.     Allergies:    Penicillins and Amoxicillin   Social History: Social History   Socioeconomic History  . Marital status: Single    Spouse name: Not on file  . Number of children: Not on file  . Years of education: Not on file  . Highest education level: Not on file  Occupational History  . Not on file  Tobacco Use  . Smoking status: Never Smoker  . Smokeless tobacco: Never Used  Vaping Use  . Vaping Use: Never used  Substance and Sexual Activity  . Alcohol use: No    Alcohol/week: 0.0 standard drinks    Comment: rarely  . Drug use: No  . Sexual activity: Never    Birth control/protection: Implant, Inserts    Comment: NEXPLANON and Nuva Ring  Other Topics Concern  . Not on file  Social History Narrative  . Not on file   Social Determinants of Health   Financial Resource Strain:   . Difficulty of Paying Living Expenses: Not on file  Food Insecurity:   . Worried About Programme researcher, broadcasting/film/video in the Last Year: Not on file  . Ran Out of Food in the Last Year: Not on file  Transportation Needs:   . Lack of  Transportation (Medical): Not on file  . Lack of Transportation (Non-Medical): Not on file  Physical Activity:   . Days of Exercise per Week: Not on file  . Minutes of Exercise per Session: Not on file  Stress:   . Feeling of Stress : Not on file  Social Connections:   . Frequency of Communication with Friends and Family: Not on file  . Frequency of Social Gatherings with Friends and Family: Not on file  . Attends Religious Services: Not on file  . Active Member of Clubs or Organizations: Not on file  . Attends Banker Meetings: Not on file  . Marital Status: Not on file     Family History: The patient's ***family history includes Anxiety disorder in her sister; Depression in her sister; Hypertension in her mother; Thyroid disease in her brother and sister.  ROS:   All other ROS reviewed and negative. Pertinent positives noted in the HPI.     EKGs/Labs/Other Studies Reviewed:   The following studies were personally reviewed by me today:  EKG:  EKG is *** ordered today.  The ekg ordered today demonstrates ***, and was personally reviewed by me.   Monitor 08/01/2019 1. No significant arrhythmias detected.  2.  Rare ectopy.    Recent Labs: 07/20/2019: BUN 15; Creatinine, Ser 0.91; Hemoglobin 13.4; Platelets 296; Potassium 3.8; Sodium 136   Recent Lipid Panel No results found for: CHOL, TRIG, HDL, CHOLHDL, VLDL, LDLCALC, LDLDIRECT  Physical Exam:   VS:  There were no vitals taken for this visit.   Wt Readings from Last 3 Encounters:  12/21/19 136 lb (61.7 kg)  09/25/19 135 lb 9.6 oz (61.5 kg)  07/25/19 136 lb 6.4 oz (61.9 kg)    General: Well nourished, well developed, in no acute distress Heart: Atraumatic, normal size  Eyes: PEERLA, EOMI  Neck: Supple, no JVD Endocrine: No thryomegaly Cardiac: Normal S1, S2; RRR; no murmurs, rubs, or gallops Lungs: Clear to auscultation bilaterally, no wheezing, rhonchi or rales  Abd: Soft, nontender, no hepatomegaly  Ext:  No edema, pulses 2+ Musculoskeletal: No deformities, BUE and BLE strength normal and equal Skin: Warm and dry, no rashes   Neuro: Alert and oriented to person, place, time, and situation, CNII-XII grossly intact, no focal deficits  Psych: Normal mood and affect   ASSESSMENT:   [name] is a 29 y.o. female who presents for the following: No diagnosis found.  PLAN:   There are no diagnoses linked to this encounter.  Disposition: No follow-ups on file.  Medication Adjustments/Labs and Tests Ordered: Current medicines are reviewed at length with the patient today.  Concerns regarding medicines are outlined above.  No orders of the defined types were placed in this encounter.  No orders of the defined types were placed in this encounter.   There are no Patient Instructions on file for this visit.   Time Spent with Patient: I have spent a total of *** minutes with patient reviewing hospital notes, telemetry, EKGs, labs and examining the patient as well as establishing an assessment and plan that was discussed with the patient.  > 50% of time was spent in direct patient care.  Signed, Lenna Gilford. Flora Lipps, MD [facility]  [address] [place], [place] [address] [phone]  01/31/2020 4:15 PM

## 2020-02-02 ENCOUNTER — Ambulatory Visit: Payer: 59 | Admitting: Cardiovascular Disease

## 2020-02-02 DIAGNOSIS — I498 Other specified cardiac arrhythmias: Secondary | ICD-10-CM

## 2020-03-20 ENCOUNTER — Other Ambulatory Visit (HOSPITAL_COMMUNITY): Payer: Self-pay | Admitting: Family Medicine

## 2020-03-20 DIAGNOSIS — R5383 Other fatigue: Secondary | ICD-10-CM | POA: Diagnosis not present

## 2020-03-20 DIAGNOSIS — F329 Major depressive disorder, single episode, unspecified: Secondary | ICD-10-CM | POA: Diagnosis not present

## 2020-03-20 DIAGNOSIS — G4710 Hypersomnia, unspecified: Secondary | ICD-10-CM | POA: Diagnosis not present

## 2020-03-20 DIAGNOSIS — F418 Other specified anxiety disorders: Secondary | ICD-10-CM | POA: Diagnosis not present

## 2020-03-20 DIAGNOSIS — J45909 Unspecified asthma, uncomplicated: Secondary | ICD-10-CM | POA: Diagnosis not present

## 2020-04-01 DIAGNOSIS — G4719 Other hypersomnia: Secondary | ICD-10-CM | POA: Diagnosis not present

## 2020-04-01 DIAGNOSIS — J45909 Unspecified asthma, uncomplicated: Secondary | ICD-10-CM | POA: Diagnosis not present

## 2020-04-01 DIAGNOSIS — F339 Major depressive disorder, recurrent, unspecified: Secondary | ICD-10-CM | POA: Diagnosis not present

## 2020-04-01 DIAGNOSIS — F5104 Psychophysiologic insomnia: Secondary | ICD-10-CM | POA: Diagnosis not present

## 2020-04-01 DIAGNOSIS — F418 Other specified anxiety disorders: Secondary | ICD-10-CM | POA: Diagnosis not present

## 2020-04-02 ENCOUNTER — Other Ambulatory Visit (HOSPITAL_BASED_OUTPATIENT_CLINIC_OR_DEPARTMENT_OTHER): Payer: Self-pay

## 2020-04-02 DIAGNOSIS — G478 Other sleep disorders: Secondary | ICD-10-CM

## 2020-04-02 DIAGNOSIS — R5383 Other fatigue: Secondary | ICD-10-CM

## 2020-04-02 DIAGNOSIS — G4710 Hypersomnia, unspecified: Secondary | ICD-10-CM

## 2020-04-21 DIAGNOSIS — M62838 Other muscle spasm: Secondary | ICD-10-CM | POA: Diagnosis not present

## 2020-04-21 DIAGNOSIS — M542 Cervicalgia: Secondary | ICD-10-CM | POA: Diagnosis not present

## 2020-04-30 ENCOUNTER — Ambulatory Visit (HOSPITAL_BASED_OUTPATIENT_CLINIC_OR_DEPARTMENT_OTHER): Payer: 59 | Admitting: Internal Medicine

## 2020-05-07 IMAGING — DX CHEST - 2 VIEW
2 series · 2 of 2 positions shown · non-contrast
Comparison: Chest x-ray from 9549

CLINICAL DATA: Aspiration of Amadou water.

EXAM:
CHEST - 2 VIEW

[chest pa]
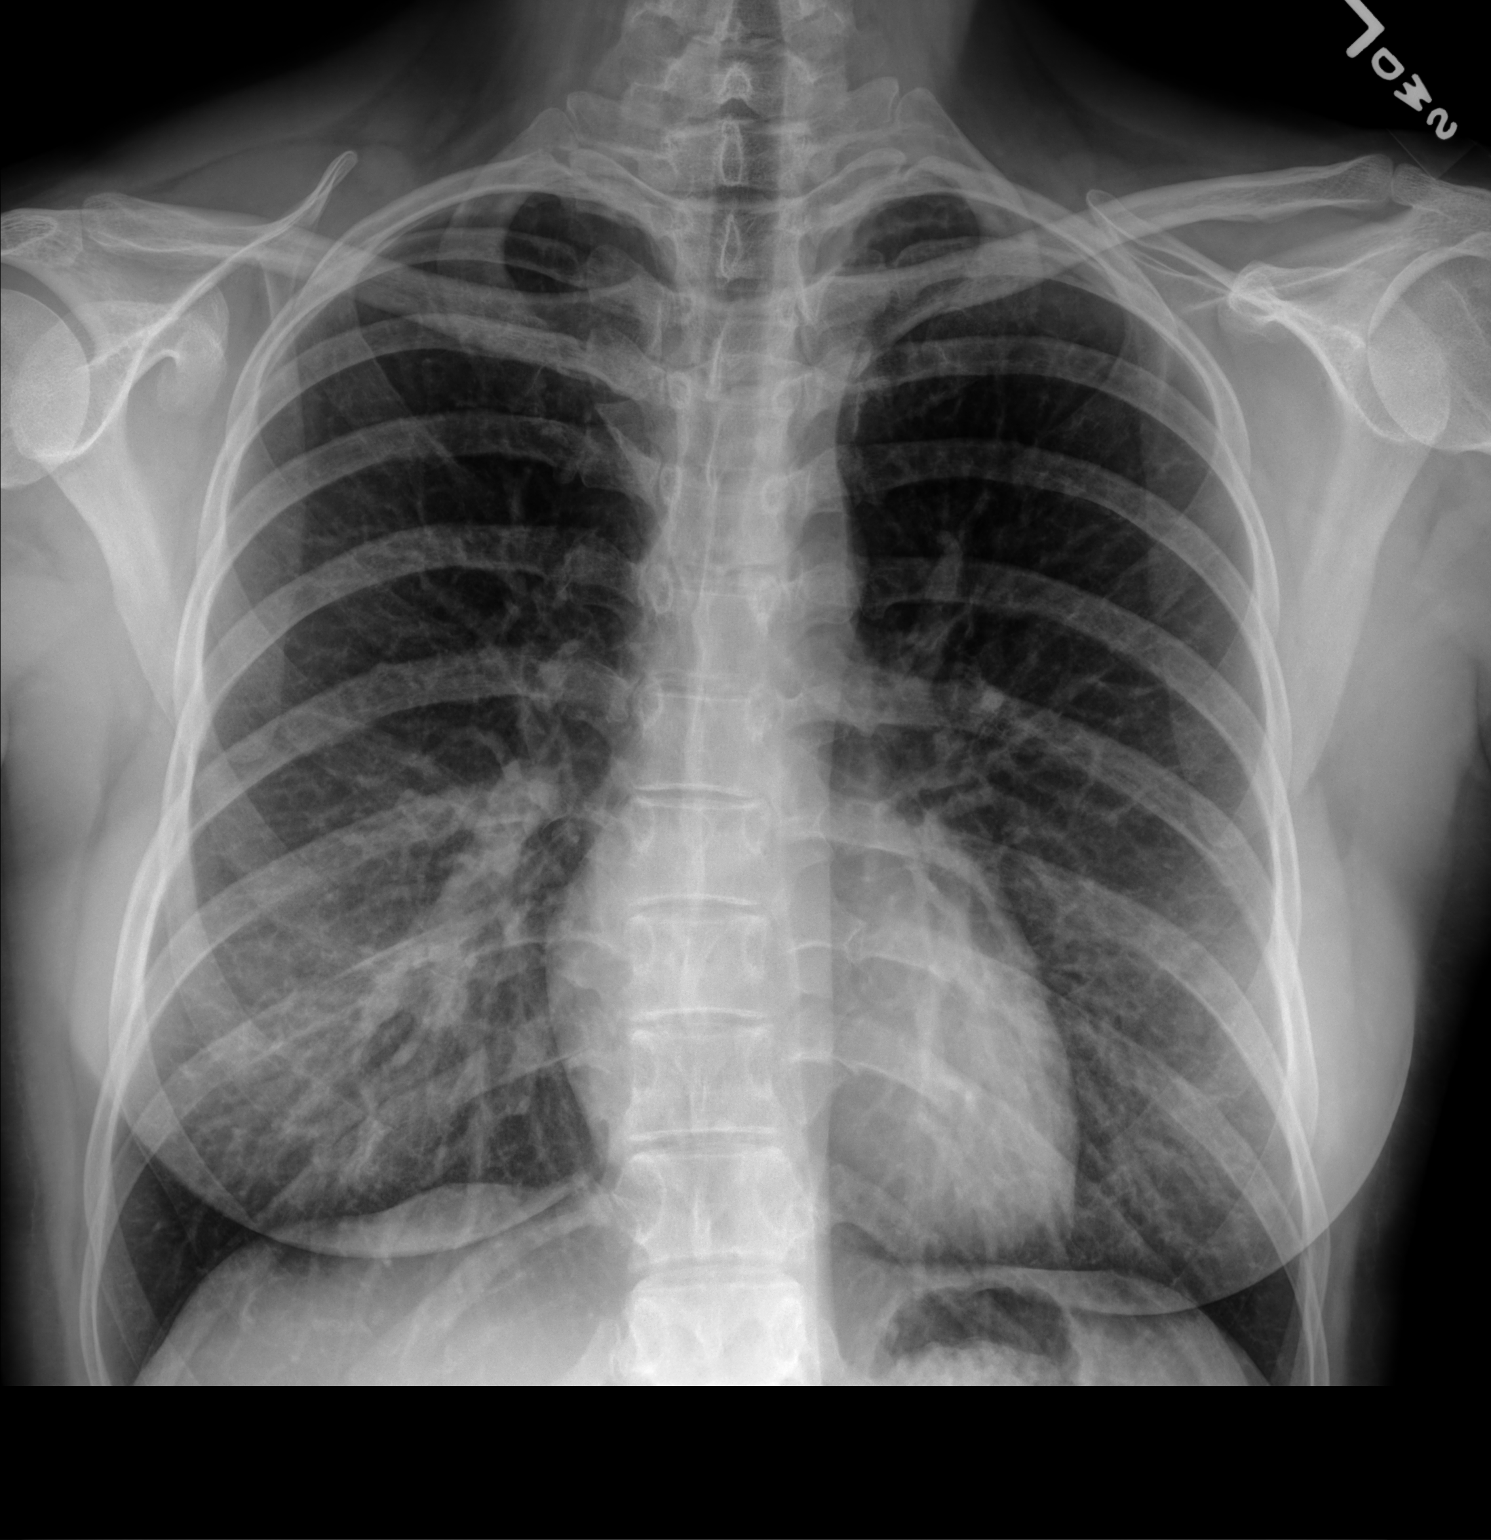

[chest lat]
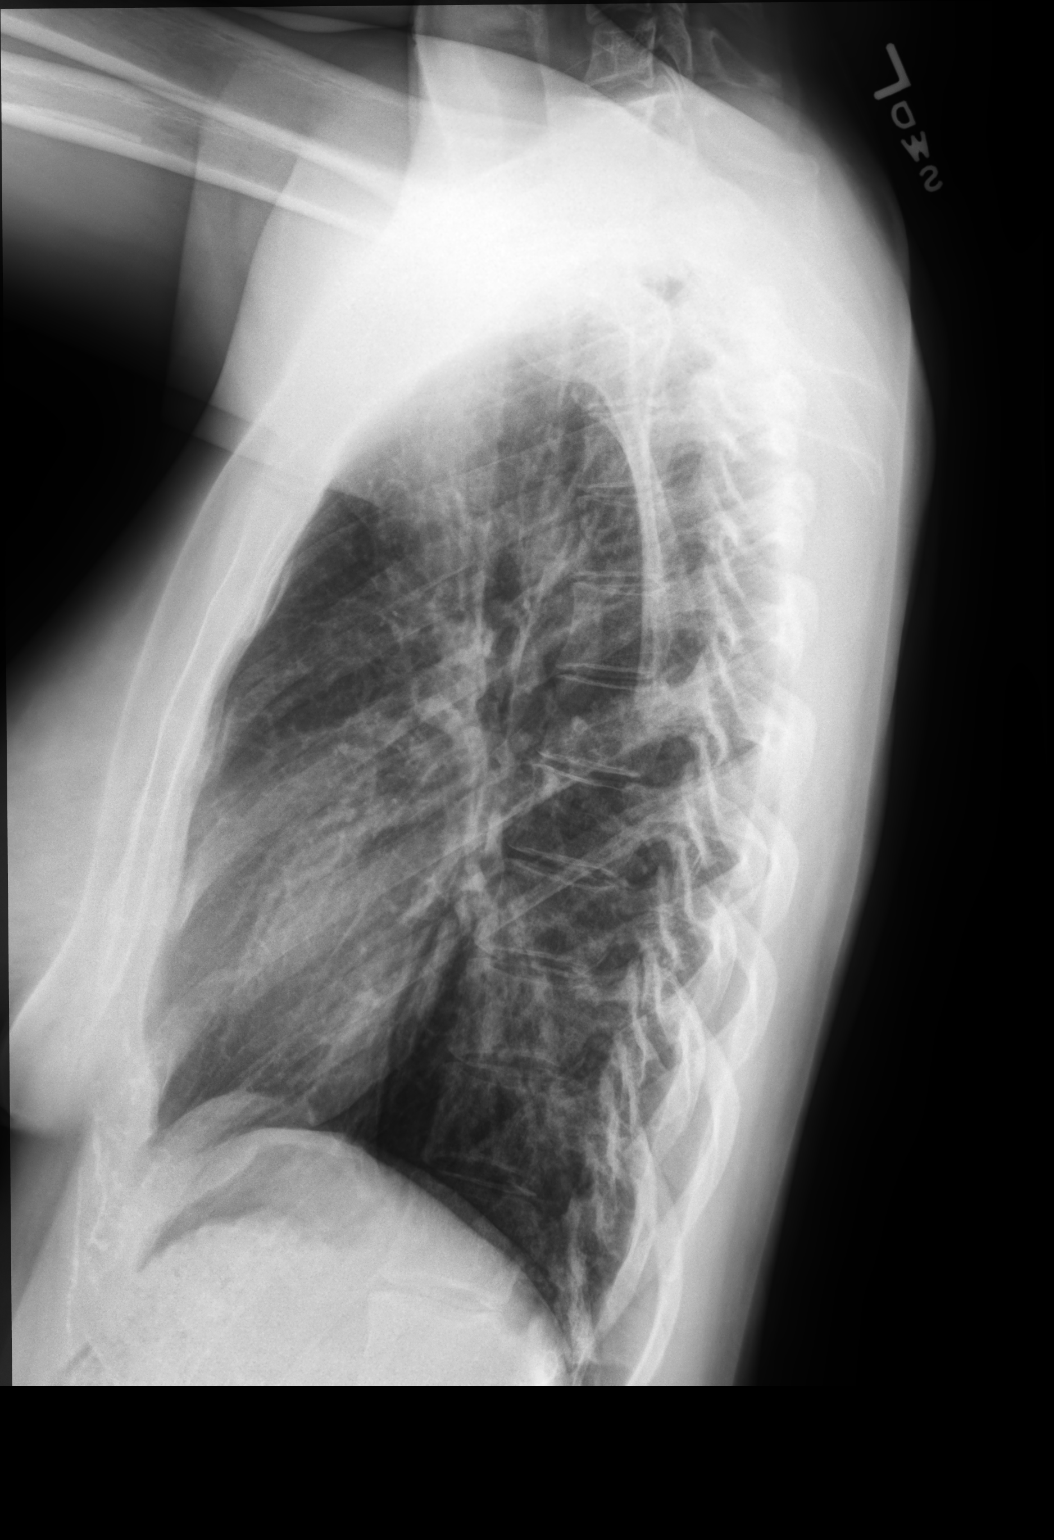

[2 of 2 positions shown; findings below may reference images not displayed]

FINDINGS: The cardiac silhouette, mediastinal and hilar contours are normal.
There are increased interstitial markings, peribronchial thickening
mainly in the lower lung zones. Findings could suggest bronchitis or
interstitial pneumonitis. No focal airspace consolidation or pleural
effusion. The bony thorax is intact.
IMPRESSION: 1. Peribronchial thickening and increased interstitial markings
mainly in the lower lung zones could suggest bronchitis/bronchial
inflammation or interstitial pneumonitis.
2. No focal infiltrate or pleural effusion.

## 2020-09-06 ENCOUNTER — Other Ambulatory Visit (HOSPITAL_COMMUNITY): Payer: Self-pay

## 2020-09-06 DIAGNOSIS — B370 Candidal stomatitis: Secondary | ICD-10-CM | POA: Diagnosis not present

## 2020-09-06 MED ORDER — FLUCONAZOLE 200 MG PO TABS
200.0000 mg | ORAL_TABLET | Freq: Every day | ORAL | 0 refills | Status: DC
  Filled 2020-09-06: qty 7, 7d supply, fill #0

## 2020-09-10 ENCOUNTER — Other Ambulatory Visit (HOSPITAL_COMMUNITY): Payer: Self-pay

## 2020-09-10 MED FILL — Fluoxetine HCl Cap 40 MG: ORAL | 90 days supply | Qty: 90 | Fill #0 | Status: AC

## 2020-09-27 DIAGNOSIS — I498 Other specified cardiac arrhythmias: Secondary | ICD-10-CM | POA: Diagnosis not present

## 2020-09-27 DIAGNOSIS — R5383 Other fatigue: Secondary | ICD-10-CM | POA: Diagnosis not present

## 2020-09-27 DIAGNOSIS — R41840 Attention and concentration deficit: Secondary | ICD-10-CM | POA: Diagnosis not present

## 2020-11-15 ENCOUNTER — Ambulatory Visit (HOSPITAL_BASED_OUTPATIENT_CLINIC_OR_DEPARTMENT_OTHER): Payer: 59 | Attending: Internal Medicine | Admitting: Sleep Medicine

## 2020-11-15 ENCOUNTER — Other Ambulatory Visit: Payer: Self-pay

## 2020-11-15 DIAGNOSIS — G4710 Hypersomnia, unspecified: Secondary | ICD-10-CM | POA: Insufficient documentation

## 2020-11-15 DIAGNOSIS — R5383 Other fatigue: Secondary | ICD-10-CM | POA: Diagnosis not present

## 2020-11-15 DIAGNOSIS — G478 Other sleep disorders: Secondary | ICD-10-CM | POA: Diagnosis not present

## 2020-12-05 DIAGNOSIS — G4733 Obstructive sleep apnea (adult) (pediatric): Secondary | ICD-10-CM | POA: Diagnosis not present

## 2020-12-05 DIAGNOSIS — J4520 Mild intermittent asthma, uncomplicated: Secondary | ICD-10-CM | POA: Diagnosis not present

## 2020-12-23 NOTE — Procedures (Signed)
    NAME: [name] [name] DATE OF BIRTH:  Jul 09, 1990 MEDICAL RECORD NUMBER [id number]  LOCATION: [facility]  PHYSICIAN:  D   DATE OF STUDY: 11/15/2020  SLEEP STUDY TYPE: Out of Center Sleep Test                REFERRING PHYSICIAN: Tempie Hoist MD  CLINICAL INFORMATION The patient was referred to the sleep center for evaluation of obstructive sleep apnea. MEDICATIONS Patient self administered medications include: N/A. No sleep medicine was administered. SLEEP STUDY TECHNIQUE A multi-channel overnight portable sleep study was performed. The channels recorded were: nasal and oral airflow, thoracic and abdominal respiratory movement, and oxygen saturation with a pulse oximetry. Snoring and body position were also monitored.  RECORDING SUMMARY The study was initiated at 11:16:09 PM and terminated at 5:45:03 AM. The total recorded time was 388.9 minutes. Time in bed was 349.7 minutes. RESPIRATORY PARAMETERS The overall AHI was 16.8 per hour, with a central apnea index of 0 per hour. The sleep efficiency was 100.0% and the patient was supine for 93.4%. The arousal index was 0.0 per hour. The oxygen nadir was 91% during sleep. CARDIAC DATA Mean heart rate during sleep was 71.3 bpm.  IMPRESSIONS - Moderete Obstructive Sleep apnea (OSA) DIAGNOSIS - Obstructive Sleep Apnea (G47.33) RECOMMENDATIONS - Therapeutic CPAP titration to determine optimal pressure required to alleviate sleep disordered breathing or a trial of Auto-CPAP 4-16 cm H2O. - Avoid alcohol, sedatives and other CNS depressants that may worsen sleep apnea and disrupt normal sleep architecture. - Sleep hygiene should be reviewed to assess factors that may improve sleep quality. - Weight management and regular exercise should be initiated or continued.     D  Diplomate, American Board of Internal Medicine  ELECTRONICALLY SIGNED ON:  12/23/2020, 1:57 PM [facility] PH: [phone]   FX: [phone] ACCREDITED BY THE AMERICAN ACADEMY OF SLEEP MEDICINE

## 2020-12-31 DIAGNOSIS — G4733 Obstructive sleep apnea (adult) (pediatric): Secondary | ICD-10-CM | POA: Diagnosis not present

## 2021-01-17 DIAGNOSIS — F902 Attention-deficit hyperactivity disorder, combined type: Secondary | ICD-10-CM | POA: Diagnosis not present

## 2021-01-17 DIAGNOSIS — Z79899 Other long term (current) drug therapy: Secondary | ICD-10-CM | POA: Diagnosis not present

## 2021-01-17 DIAGNOSIS — F419 Anxiety disorder, unspecified: Secondary | ICD-10-CM | POA: Diagnosis not present

## 2021-01-17 DIAGNOSIS — F331 Major depressive disorder, recurrent, moderate: Secondary | ICD-10-CM | POA: Diagnosis not present

## 2021-01-17 DIAGNOSIS — R41840 Attention and concentration deficit: Secondary | ICD-10-CM | POA: Diagnosis not present

## 2021-01-20 ENCOUNTER — Other Ambulatory Visit (HOSPITAL_COMMUNITY): Payer: Self-pay

## 2021-01-20 MED ORDER — METHYLPHENIDATE HCL ER (CD) 20 MG PO CPCR
20.0000 mg | ORAL_CAPSULE | Freq: Every day | ORAL | 0 refills | Status: DC
  Filled 2021-01-20: qty 30, 30d supply, fill #0

## 2021-01-21 DIAGNOSIS — Z20822 Contact with and (suspected) exposure to covid-19: Secondary | ICD-10-CM | POA: Diagnosis not present

## 2021-01-22 ENCOUNTER — Other Ambulatory Visit (HOSPITAL_COMMUNITY): Payer: Self-pay

## 2021-01-30 DIAGNOSIS — G4733 Obstructive sleep apnea (adult) (pediatric): Secondary | ICD-10-CM | POA: Diagnosis not present

## 2021-02-10 DIAGNOSIS — Z79899 Other long term (current) drug therapy: Secondary | ICD-10-CM | POA: Diagnosis not present

## 2021-02-10 DIAGNOSIS — F902 Attention-deficit hyperactivity disorder, combined type: Secondary | ICD-10-CM | POA: Diagnosis not present

## 2021-02-10 DIAGNOSIS — F331 Major depressive disorder, recurrent, moderate: Secondary | ICD-10-CM | POA: Diagnosis not present

## 2021-02-10 DIAGNOSIS — F419 Anxiety disorder, unspecified: Secondary | ICD-10-CM | POA: Diagnosis not present

## 2021-02-19 ENCOUNTER — Other Ambulatory Visit (HOSPITAL_COMMUNITY): Payer: Self-pay

## 2021-02-19 MED ORDER — METHYLPHENIDATE HCL ER (CD) 40 MG PO CPCR
40.0000 mg | ORAL_CAPSULE | Freq: Every day | ORAL | 0 refills | Status: DC
  Filled 2021-02-19: qty 30, 30d supply, fill #0

## 2021-02-20 ENCOUNTER — Other Ambulatory Visit (HOSPITAL_COMMUNITY): Payer: Self-pay

## 2021-02-26 DIAGNOSIS — G4733 Obstructive sleep apnea (adult) (pediatric): Secondary | ICD-10-CM | POA: Diagnosis not present

## 2021-02-28 ENCOUNTER — Other Ambulatory Visit (HOSPITAL_COMMUNITY): Payer: Self-pay

## 2021-03-03 ENCOUNTER — Other Ambulatory Visit (HOSPITAL_COMMUNITY): Payer: Self-pay

## 2021-05-07 ENCOUNTER — Other Ambulatory Visit (HOSPITAL_COMMUNITY): Payer: Self-pay

## 2021-05-07 MED ORDER — METHYLPHENIDATE HCL ER (CD) 40 MG PO CPCR
40.0000 mg | ORAL_CAPSULE | Freq: Every day | ORAL | 0 refills | Status: DC
  Filled 2021-05-07 – 2021-05-12 (×2): qty 30, 30d supply, fill #0

## 2021-05-12 ENCOUNTER — Other Ambulatory Visit (HOSPITAL_COMMUNITY): Payer: Self-pay

## 2021-06-11 ENCOUNTER — Other Ambulatory Visit (HOSPITAL_COMMUNITY): Payer: Self-pay

## 2021-06-11 MED ORDER — METHYLPHENIDATE HCL ER (CD) 40 MG PO CPCR
40.0000 mg | ORAL_CAPSULE | Freq: Every day | ORAL | 0 refills | Status: DC
  Filled 2021-06-11: qty 10, 10d supply, fill #0
  Filled 2021-06-12: qty 20, 20d supply, fill #0

## 2021-06-12 ENCOUNTER — Other Ambulatory Visit (HOSPITAL_COMMUNITY): Payer: Self-pay

## 2021-06-20 ENCOUNTER — Other Ambulatory Visit (HOSPITAL_COMMUNITY): Payer: Self-pay

## 2021-07-15 ENCOUNTER — Other Ambulatory Visit (HOSPITAL_COMMUNITY): Payer: Self-pay

## 2021-07-15 MED ORDER — METHYLPHENIDATE HCL ER (CD) 40 MG PO CPCR
40.0000 mg | ORAL_CAPSULE | Freq: Every day | ORAL | 0 refills | Status: AC
  Filled 2021-07-15 – 2021-10-06 (×2): qty 30, 30d supply, fill #0

## 2021-07-24 ENCOUNTER — Other Ambulatory Visit (HOSPITAL_COMMUNITY): Payer: Self-pay

## 2021-07-25 ENCOUNTER — Other Ambulatory Visit (HOSPITAL_COMMUNITY): Payer: Self-pay

## 2021-08-14 ENCOUNTER — Other Ambulatory Visit (HOSPITAL_COMMUNITY): Payer: Self-pay

## 2021-08-14 MED ORDER — METHYLPHENIDATE HCL ER (CD) 40 MG PO CPCR
40.0000 mg | ORAL_CAPSULE | Freq: Every day | ORAL | 0 refills | Status: AC
  Filled 2021-08-14: qty 30, 30d supply, fill #0
  Filled 2021-08-21: qty 20, 20d supply, fill #0

## 2021-08-14 MED ORDER — METHYLPHENIDATE HCL ER (CD) 40 MG PO CPCR
40.0000 mg | ORAL_CAPSULE | Freq: Every day | ORAL | 0 refills | Status: AC
  Filled 2021-12-11: qty 30, 30d supply, fill #0

## 2021-08-14 MED ORDER — METHYLPHENIDATE HCL ER (CD) 40 MG PO CPCR
40.0000 mg | ORAL_CAPSULE | Freq: Every day | ORAL | 0 refills | Status: AC
  Filled 2021-08-14: qty 30, 30d supply, fill #0

## 2021-08-15 ENCOUNTER — Other Ambulatory Visit (HOSPITAL_COMMUNITY): Payer: Self-pay

## 2021-08-21 ENCOUNTER — Other Ambulatory Visit (HOSPITAL_COMMUNITY): Payer: Self-pay

## 2021-10-06 ENCOUNTER — Other Ambulatory Visit (HOSPITAL_COMMUNITY): Payer: Self-pay

## 2021-10-06 MED ORDER — NITROFURANTOIN MONOHYD MACRO 100 MG PO CAPS
100.0000 mg | ORAL_CAPSULE | Freq: Two times a day (BID) | ORAL | 0 refills | Status: AC
  Filled 2021-10-06: qty 10, 5d supply, fill #0

## 2021-10-07 ENCOUNTER — Other Ambulatory Visit (HOSPITAL_COMMUNITY): Payer: Self-pay

## 2021-10-10 ENCOUNTER — Telehealth: Payer: Self-pay | Admitting: Licensed Clinical Social Worker

## 2021-10-10 NOTE — Telephone Encounter (Signed)
Returned a call for [employer]. Someone by the name of [place] called [facility] stating that pt was looking for counseling services and [facility] was in network, however pt is not a pt of ours. [place] stated she has some life stressors and depression and would like to see someone in the evenings that specialize in the LGBTQ+ area.[facility] referred her to [email or web address] as well as the [facility].

## 2021-10-16 ENCOUNTER — Other Ambulatory Visit (HOSPITAL_COMMUNITY): Payer: Self-pay

## 2021-10-16 MED ORDER — FLUTICASONE PROPIONATE HFA 110 MCG/ACT IN AERO
2.0000 | INHALATION_SPRAY | Freq: Two times a day (BID) | RESPIRATORY_TRACT | 0 refills | Status: AC
  Filled 2021-10-16: qty 12, 30d supply, fill #0

## 2021-10-16 MED ORDER — AZITHROMYCIN 250 MG PO TABS
ORAL_TABLET | ORAL | 0 refills | Status: AC
  Filled 2021-10-16: qty 6, 5d supply, fill #0

## 2021-11-18 ENCOUNTER — Other Ambulatory Visit (HOSPITAL_COMMUNITY): Payer: Self-pay

## 2021-11-18 MED ORDER — METHYLPHENIDATE HCL ER (CD) 40 MG PO CPCR
40.0000 mg | ORAL_CAPSULE | Freq: Every day | ORAL | 0 refills | Status: AC
  Filled 2021-11-18: qty 30, 30d supply, fill #0

## 2021-11-18 MED ORDER — METHYLPHENIDATE HCL ER (CD) 40 MG PO CPCR
40.0000 mg | ORAL_CAPSULE | Freq: Every day | ORAL | 0 refills | Status: AC
  Filled 2021-11-18 – 2022-02-12 (×3): qty 30, 30d supply, fill #0

## 2021-11-18 MED ORDER — METHYLPHENIDATE HCL ER (CD) 40 MG PO CPCR
40.0000 mg | ORAL_CAPSULE | Freq: Every day | ORAL | 0 refills | Status: DC
  Filled 2021-11-18 – 2022-04-20 (×2): qty 30, 30d supply, fill #0

## 2021-12-02 ENCOUNTER — Other Ambulatory Visit (HOSPITAL_COMMUNITY): Payer: Self-pay

## 2021-12-11 ENCOUNTER — Other Ambulatory Visit (HOSPITAL_COMMUNITY): Payer: Self-pay

## 2021-12-11 MED ORDER — PROMETHAZINE-CODEINE 6.25-10 MG/5ML PO SOLN: 5.0000 mL | Freq: Four times a day (QID) | ORAL | 0 refills | Status: AC | PRN

## 2021-12-11 MED ORDER — BENZONATATE 200 MG PO CAPS
200.0000 mg | ORAL_CAPSULE | Freq: Three times a day (TID) | ORAL | 0 refills | Status: AC
  Filled 2021-12-11: qty 15, 5d supply, fill #0

## 2021-12-29 ENCOUNTER — Ambulatory Visit (INDEPENDENT_AMBULATORY_CARE_PROVIDER_SITE_OTHER): Payer: No Typology Code available for payment source | Admitting: Orthopedic Surgery

## 2021-12-29 ENCOUNTER — Ambulatory Visit: Payer: Self-pay | Admitting: Orthopedic Surgery

## 2021-12-29 ENCOUNTER — Ambulatory Visit (INDEPENDENT_AMBULATORY_CARE_PROVIDER_SITE_OTHER): Payer: No Typology Code available for payment source

## 2021-12-29 DIAGNOSIS — M79641 Pain in right hand: Secondary | ICD-10-CM

## 2022-01-13 ENCOUNTER — Encounter: Payer: Self-pay | Admitting: Orthopedic Surgery

## 2022-01-13 NOTE — Progress Notes (Signed)
Office Visit Note   Patient: [name] [name]           Date of Birth: [birth year]           MRN: [id number] Visit Date: 12/29/2021              Requested by: Wilfrid Lund, PA [address] [place],  [place] [address] PCP: Wilfrid Lund, [place]  Chief Complaint  Patient presents with   Right Thumb - Pain      HPI: Patient is a 31 year old woman who states that about 2 months ago she injured her right thumb while dancing falling backwards sustaining a hyperextension injury to her thumb.  Patient states she still has pain in the right thumb.  Assessment & Plan: Visit Diagnoses:  1. Pain in right hand     Plan: Recommended she could try a thumb spica splint and use Voltaren gel.  The ulnar collateral ligament is stable.  Follow-Up Instructions: No follow-ups on file.   Ortho Exam  Patient is alert, oriented, no adenopathy, well-dressed, normal affect, normal respiratory effort. Examination patient has good pulses her hand is neurovascular intact.  She has no pain to palpation over the scaphoid scapholunate or TFCC.  Stress of the ulnar collateral ligament is stable bilaterally with a good endpoint.  There is good extension and flexion of the thumb.  Imaging: No results found. No images are attached to the encounter.  Labs: Lab Results  Component Value Date   REPTSTATUS 07/22/2019 FINAL 07/20/2019   CULT  07/20/2019    NO GROWTH Performed at [facility], [address] [place], [place] [address]      Lab Results  Component Value Date   ALBUMIN 4.5 10/20/2016   ALBUMIN 4.7 07/16/2015   ALBUMIN 4.0 03/04/2013    No results found for: "MG" No results found for: "VD25OH"  No results found for: "PREALBUMIN"    Latest Ref Rng & Units 07/20/2019    3:13 PM 10/20/2016    3:10 AM 07/16/2015   12:31 PM  CBC EXTENDED  WBC 4.0 - 10.5 K/uL 7.7  5.2  3.1   RBC 3.87 - 5.11 MIL/uL 4.47  4.73  4.89   Hemoglobin 12.0 - 15.0 g/dL 09.3  23.5  57.3   HCT 36.0 -  46.0 % 39.8  39.2  42.1   Platelets 150 - 400 K/uL 296  277  186   NEUT# 1.4 - 6.5 K/uL   1.5   Lymph# 1.0 - 3.6 K/uL   0.9      There is no height or weight on file to calculate BMI.  Orders:  Orders Placed This Encounter  Procedures   XR Hand Complete Right   No orders of the defined types were placed in this encounter.    Procedures: No procedures performed  Clinical Data: No additional findings.  ROS:  All other systems negative, except as noted in the HPI. Review of Systems  Objective: Vital Signs: There were no vitals taken for this visit.  Specialty Comments:  No specialty comments available.  PMFS History: Patient Active Problem List   Diagnosis Date Noted   Severe recurrent major depression without psychotic features (HCC) 10/27/2016    Class: Chronic   Nexplanon in place 05/14/2016   Right thyroid nodule 01/19/2015   POTS (postural orthostatic tachycardia syndrome) 11/23/2013   Palpitations 11/23/2013   Chest pain    Nausea and vomiting 11/02/2011   Vertigo 11/01/2011  Labyrinthitis 11/01/2011   Seizure disorder (HCC) 11/01/2011   Asthma 11/01/2011   Past Medical History:  Diagnosis Date   Asthma    Chest pain    Depression    Pott's disease    Seizures (HCC)     Family History  Problem Relation Age of Onset   Thyroid disease Brother        hashimoto's thyroiditis   Hypertension Mother    Thyroid disease Sister    Anxiety disorder Sister    Depression Sister     Past Surgical History:  Procedure Laterality Date   TONSILLECTOMY     Social History   Occupational History   Not on file  Tobacco Use   Smoking status: Never   Smokeless tobacco: Never  Vaping Use   Vaping Use: Never used  Substance and Sexual Activity   Alcohol use: No    Alcohol/week: 0.0 standard drinks of alcohol    Comment: rarely   Drug use: No   Sexual activity: Never    Birth control/protection: Implant, Inserts    Comment: NEXPLANON and Nuva Ring

## 2022-02-12 ENCOUNTER — Other Ambulatory Visit (HOSPITAL_COMMUNITY): Payer: Self-pay

## 2022-02-17 ENCOUNTER — Other Ambulatory Visit (HOSPITAL_COMMUNITY): Payer: Self-pay

## 2022-02-17 MED ORDER — FLUOXETINE HCL 40 MG PO CAPS
40.0000 mg | ORAL_CAPSULE | Freq: Every day | ORAL | 5 refills | Status: AC
  Filled 2022-02-17 – 2022-02-25 (×2): qty 30, 30d supply, fill #0
  Filled 2022-04-20: qty 30, 30d supply, fill #1
  Filled 2022-05-28: qty 30, 30d supply, fill #2
  Filled 2022-07-09: qty 30, 30d supply, fill #3

## 2022-02-25 ENCOUNTER — Other Ambulatory Visit (HOSPITAL_COMMUNITY): Payer: Self-pay

## 2022-03-13 DIAGNOSIS — F900 Attention-deficit hyperactivity disorder, predominantly inattentive type: Secondary | ICD-10-CM | POA: Diagnosis not present

## 2022-03-13 DIAGNOSIS — F339 Major depressive disorder, recurrent, unspecified: Secondary | ICD-10-CM | POA: Diagnosis not present

## 2022-03-27 DIAGNOSIS — F900 Attention-deficit hyperactivity disorder, predominantly inattentive type: Secondary | ICD-10-CM | POA: Diagnosis not present

## 2022-03-27 DIAGNOSIS — F339 Major depressive disorder, recurrent, unspecified: Secondary | ICD-10-CM | POA: Diagnosis not present

## 2022-04-10 DIAGNOSIS — F900 Attention-deficit hyperactivity disorder, predominantly inattentive type: Secondary | ICD-10-CM | POA: Diagnosis not present

## 2022-04-10 DIAGNOSIS — F339 Major depressive disorder, recurrent, unspecified: Secondary | ICD-10-CM | POA: Diagnosis not present

## 2022-04-20 ENCOUNTER — Other Ambulatory Visit (HOSPITAL_COMMUNITY): Payer: Self-pay

## 2022-04-24 DIAGNOSIS — F339 Major depressive disorder, recurrent, unspecified: Secondary | ICD-10-CM | POA: Diagnosis not present

## 2022-04-24 DIAGNOSIS — F900 Attention-deficit hyperactivity disorder, predominantly inattentive type: Secondary | ICD-10-CM | POA: Diagnosis not present

## 2022-05-08 DIAGNOSIS — F339 Major depressive disorder, recurrent, unspecified: Secondary | ICD-10-CM | POA: Diagnosis not present

## 2022-05-08 DIAGNOSIS — F900 Attention-deficit hyperactivity disorder, predominantly inattentive type: Secondary | ICD-10-CM | POA: Diagnosis not present

## 2022-05-13 ENCOUNTER — Other Ambulatory Visit (HOSPITAL_COMMUNITY): Payer: Self-pay

## 2022-05-13 DIAGNOSIS — F909 Attention-deficit hyperactivity disorder, unspecified type: Secondary | ICD-10-CM | POA: Diagnosis not present

## 2022-05-13 DIAGNOSIS — G40909 Epilepsy, unspecified, not intractable, without status epilepticus: Secondary | ICD-10-CM | POA: Diagnosis not present

## 2022-05-13 DIAGNOSIS — J45909 Unspecified asthma, uncomplicated: Secondary | ICD-10-CM | POA: Diagnosis not present

## 2022-05-13 DIAGNOSIS — Z13220 Encounter for screening for lipoid disorders: Secondary | ICD-10-CM | POA: Diagnosis not present

## 2022-05-13 DIAGNOSIS — G4733 Obstructive sleep apnea (adult) (pediatric): Secondary | ICD-10-CM | POA: Diagnosis not present

## 2022-05-13 DIAGNOSIS — J309 Allergic rhinitis, unspecified: Secondary | ICD-10-CM | POA: Diagnosis not present

## 2022-05-13 DIAGNOSIS — Z0000 Encounter for general adult medical examination without abnormal findings: Secondary | ICD-10-CM | POA: Diagnosis not present

## 2022-05-13 DIAGNOSIS — F329 Major depressive disorder, single episode, unspecified: Secondary | ICD-10-CM | POA: Diagnosis not present

## 2022-05-13 DIAGNOSIS — R5383 Other fatigue: Secondary | ICD-10-CM | POA: Diagnosis not present

## 2022-05-13 DIAGNOSIS — Z23 Encounter for immunization: Secondary | ICD-10-CM | POA: Diagnosis not present

## 2022-05-13 MED ORDER — FLUTICASONE PROPIONATE HFA 110 MCG/ACT IN AERO
2.0000 | INHALATION_SPRAY | RESPIRATORY_TRACT | 1 refills | Status: AC
  Filled 2022-05-13 – 2022-05-29 (×2): qty 12, 30d supply, fill #0

## 2022-05-22 DIAGNOSIS — F339 Major depressive disorder, recurrent, unspecified: Secondary | ICD-10-CM | POA: Diagnosis not present

## 2022-05-22 DIAGNOSIS — F900 Attention-deficit hyperactivity disorder, predominantly inattentive type: Secondary | ICD-10-CM | POA: Diagnosis not present

## 2022-05-28 ENCOUNTER — Other Ambulatory Visit (HOSPITAL_COMMUNITY): Payer: Self-pay

## 2022-05-29 ENCOUNTER — Other Ambulatory Visit (HOSPITAL_COMMUNITY): Payer: Self-pay

## 2022-05-29 MED ORDER — METHYLPHENIDATE HCL ER (CD) 40 MG PO CPCR
40.0000 mg | ORAL_CAPSULE | Freq: Every day | ORAL | 0 refills | Status: DC
  Filled 2022-05-29: qty 30, 30d supply, fill #0

## 2022-06-05 ENCOUNTER — Other Ambulatory Visit (HOSPITAL_COMMUNITY): Payer: Self-pay

## 2022-06-05 DIAGNOSIS — Z5181 Encounter for therapeutic drug level monitoring: Secondary | ICD-10-CM | POA: Diagnosis not present

## 2022-06-05 DIAGNOSIS — F331 Major depressive disorder, recurrent, moderate: Secondary | ICD-10-CM | POA: Diagnosis not present

## 2022-06-05 DIAGNOSIS — F902 Attention-deficit hyperactivity disorder, combined type: Secondary | ICD-10-CM | POA: Diagnosis not present

## 2022-06-05 DIAGNOSIS — Z79891 Long term (current) use of opiate analgesic: Secondary | ICD-10-CM | POA: Diagnosis not present

## 2022-06-05 DIAGNOSIS — F419 Anxiety disorder, unspecified: Secondary | ICD-10-CM | POA: Diagnosis not present

## 2022-06-05 DIAGNOSIS — Z79899 Other long term (current) drug therapy: Secondary | ICD-10-CM | POA: Diagnosis not present

## 2022-06-05 MED ORDER — METHYLPHENIDATE HCL ER (CD) 40 MG PO CPCR: 40.0000 mg | ORAL_CAPSULE | Freq: Every day | ORAL | 0 refills | Status: AC

## 2022-06-05 MED ORDER — METHYLPHENIDATE HCL ER (CD) 40 MG PO CPCR
40.0000 mg | ORAL_CAPSULE | Freq: Every day | ORAL | 0 refills | Status: AC
  Filled 2022-07-09: qty 30, 30d supply, fill #0

## 2022-06-18 ENCOUNTER — Other Ambulatory Visit: Payer: Self-pay

## 2022-06-19 DIAGNOSIS — F900 Attention-deficit hyperactivity disorder, predominantly inattentive type: Secondary | ICD-10-CM | POA: Diagnosis not present

## 2022-06-19 DIAGNOSIS — F339 Major depressive disorder, recurrent, unspecified: Secondary | ICD-10-CM | POA: Diagnosis not present

## 2022-07-03 DIAGNOSIS — F339 Major depressive disorder, recurrent, unspecified: Secondary | ICD-10-CM | POA: Diagnosis not present

## 2022-07-03 DIAGNOSIS — F900 Attention-deficit hyperactivity disorder, predominantly inattentive type: Secondary | ICD-10-CM | POA: Diagnosis not present

## 2022-07-09 ENCOUNTER — Other Ambulatory Visit: Payer: Self-pay

## 2022-07-09 ENCOUNTER — Other Ambulatory Visit (HOSPITAL_COMMUNITY): Payer: Self-pay

## 2022-07-21 ENCOUNTER — Other Ambulatory Visit (HOSPITAL_COMMUNITY): Payer: Self-pay

## 2022-07-31 DIAGNOSIS — F339 Major depressive disorder, recurrent, unspecified: Secondary | ICD-10-CM | POA: Diagnosis not present

## 2022-07-31 DIAGNOSIS — F900 Attention-deficit hyperactivity disorder, predominantly inattentive type: Secondary | ICD-10-CM | POA: Diagnosis not present

## 2022-08-14 DIAGNOSIS — F339 Major depressive disorder, recurrent, unspecified: Secondary | ICD-10-CM | POA: Diagnosis not present

## 2022-08-14 DIAGNOSIS — F900 Attention-deficit hyperactivity disorder, predominantly inattentive type: Secondary | ICD-10-CM | POA: Diagnosis not present

## 2022-08-28 DIAGNOSIS — F339 Major depressive disorder, recurrent, unspecified: Secondary | ICD-10-CM | POA: Diagnosis not present

## 2022-08-28 DIAGNOSIS — F331 Major depressive disorder, recurrent, moderate: Secondary | ICD-10-CM | POA: Diagnosis not present

## 2022-08-28 DIAGNOSIS — F900 Attention-deficit hyperactivity disorder, predominantly inattentive type: Secondary | ICD-10-CM | POA: Diagnosis not present

## 2022-08-28 DIAGNOSIS — F419 Anxiety disorder, unspecified: Secondary | ICD-10-CM | POA: Diagnosis not present

## 2022-08-28 DIAGNOSIS — Z79899 Other long term (current) drug therapy: Secondary | ICD-10-CM | POA: Diagnosis not present

## 2022-08-28 DIAGNOSIS — F902 Attention-deficit hyperactivity disorder, combined type: Secondary | ICD-10-CM | POA: Diagnosis not present

## 2022-08-31 ENCOUNTER — Other Ambulatory Visit: Payer: Self-pay

## 2022-08-31 ENCOUNTER — Other Ambulatory Visit (HOSPITAL_COMMUNITY): Payer: Self-pay

## 2022-08-31 MED ORDER — METHYLPHENIDATE HCL ER (CD) 40 MG PO CPCR
40.0000 mg | ORAL_CAPSULE | Freq: Every day | ORAL | 0 refills | Status: AC
  Filled 2022-08-31: qty 30, 30d supply, fill #0

## 2022-08-31 MED ORDER — FLUOXETINE HCL 40 MG PO CAPS
40.0000 mg | ORAL_CAPSULE | Freq: Every day | ORAL | 5 refills | Status: AC
  Filled 2022-08-31: qty 30, 30d supply, fill #0
  Filled 2022-10-20: qty 30, 30d supply, fill #1
  Filled 2022-12-03 – 2023-01-12 (×2): qty 30, 30d supply, fill #2

## 2022-08-31 MED ORDER — METHYLPHENIDATE HCL ER (CD) 40 MG PO CPCR
40.0000 mg | ORAL_CAPSULE | Freq: Every day | ORAL | 0 refills | Status: AC
  Filled 2022-10-20: qty 30, 30d supply, fill #0

## 2022-08-31 MED ORDER — METHYLPHENIDATE HCL ER (CD) 40 MG PO CPCR
40.0000 mg | ORAL_CAPSULE | Freq: Every day | ORAL | 0 refills | Status: AC
  Filled 2023-01-12: qty 30, 30d supply, fill #0

## 2022-09-11 DIAGNOSIS — F900 Attention-deficit hyperactivity disorder, predominantly inattentive type: Secondary | ICD-10-CM | POA: Diagnosis not present

## 2022-09-11 DIAGNOSIS — F339 Major depressive disorder, recurrent, unspecified: Secondary | ICD-10-CM | POA: Diagnosis not present

## 2022-09-25 DIAGNOSIS — F339 Major depressive disorder, recurrent, unspecified: Secondary | ICD-10-CM | POA: Diagnosis not present

## 2022-09-25 DIAGNOSIS — F900 Attention-deficit hyperactivity disorder, predominantly inattentive type: Secondary | ICD-10-CM | POA: Diagnosis not present

## 2022-10-09 DIAGNOSIS — F339 Major depressive disorder, recurrent, unspecified: Secondary | ICD-10-CM | POA: Diagnosis not present

## 2022-10-09 DIAGNOSIS — F900 Attention-deficit hyperactivity disorder, predominantly inattentive type: Secondary | ICD-10-CM | POA: Diagnosis not present

## 2022-10-20 ENCOUNTER — Other Ambulatory Visit (HOSPITAL_COMMUNITY): Payer: Self-pay

## 2022-10-20 ENCOUNTER — Other Ambulatory Visit: Payer: Self-pay

## 2022-10-21 ENCOUNTER — Other Ambulatory Visit (HOSPITAL_COMMUNITY): Payer: Self-pay

## 2022-10-23 DIAGNOSIS — F900 Attention-deficit hyperactivity disorder, predominantly inattentive type: Secondary | ICD-10-CM | POA: Diagnosis not present

## 2022-10-23 DIAGNOSIS — F339 Major depressive disorder, recurrent, unspecified: Secondary | ICD-10-CM | POA: Diagnosis not present

## 2022-11-06 DIAGNOSIS — F339 Major depressive disorder, recurrent, unspecified: Secondary | ICD-10-CM | POA: Diagnosis not present

## 2022-11-06 DIAGNOSIS — F900 Attention-deficit hyperactivity disorder, predominantly inattentive type: Secondary | ICD-10-CM | POA: Diagnosis not present

## 2022-11-20 DIAGNOSIS — F339 Major depressive disorder, recurrent, unspecified: Secondary | ICD-10-CM | POA: Diagnosis not present

## 2022-11-20 DIAGNOSIS — F900 Attention-deficit hyperactivity disorder, predominantly inattentive type: Secondary | ICD-10-CM | POA: Diagnosis not present

## 2022-11-27 ENCOUNTER — Other Ambulatory Visit (HOSPITAL_COMMUNITY): Payer: Self-pay

## 2022-11-27 DIAGNOSIS — F419 Anxiety disorder, unspecified: Secondary | ICD-10-CM | POA: Diagnosis not present

## 2022-11-27 DIAGNOSIS — Z79899 Other long term (current) drug therapy: Secondary | ICD-10-CM | POA: Diagnosis not present

## 2022-11-27 DIAGNOSIS — F902 Attention-deficit hyperactivity disorder, combined type: Secondary | ICD-10-CM | POA: Diagnosis not present

## 2022-11-27 MED ORDER — METHYLPHENIDATE HCL ER (OSM) 54 MG PO TBCR
54.0000 mg | EXTENDED_RELEASE_TABLET | Freq: Every day | ORAL | 0 refills | Status: DC
  Filled 2022-11-27: qty 30, 30d supply, fill #0

## 2022-12-03 ENCOUNTER — Other Ambulatory Visit (HOSPITAL_COMMUNITY): Payer: Self-pay

## 2022-12-04 DIAGNOSIS — F900 Attention-deficit hyperactivity disorder, predominantly inattentive type: Secondary | ICD-10-CM | POA: Diagnosis not present

## 2022-12-04 DIAGNOSIS — F339 Major depressive disorder, recurrent, unspecified: Secondary | ICD-10-CM | POA: Diagnosis not present

## 2022-12-15 ENCOUNTER — Other Ambulatory Visit (HOSPITAL_COMMUNITY): Payer: Self-pay

## 2022-12-18 DIAGNOSIS — F900 Attention-deficit hyperactivity disorder, predominantly inattentive type: Secondary | ICD-10-CM | POA: Diagnosis not present

## 2022-12-18 DIAGNOSIS — F339 Major depressive disorder, recurrent, unspecified: Secondary | ICD-10-CM | POA: Diagnosis not present

## 2023-01-01 DIAGNOSIS — F900 Attention-deficit hyperactivity disorder, predominantly inattentive type: Secondary | ICD-10-CM | POA: Diagnosis not present

## 2023-01-01 DIAGNOSIS — F339 Major depressive disorder, recurrent, unspecified: Secondary | ICD-10-CM | POA: Diagnosis not present

## 2023-01-12 ENCOUNTER — Other Ambulatory Visit (HOSPITAL_COMMUNITY): Payer: Self-pay

## 2023-01-12 DIAGNOSIS — E559 Vitamin D deficiency, unspecified: Secondary | ICD-10-CM | POA: Diagnosis not present

## 2023-01-12 DIAGNOSIS — R5383 Other fatigue: Secondary | ICD-10-CM | POA: Diagnosis not present

## 2023-01-12 DIAGNOSIS — J45909 Unspecified asthma, uncomplicated: Secondary | ICD-10-CM | POA: Diagnosis not present

## 2023-01-12 DIAGNOSIS — G90A Postural orthostatic tachycardia syndrome (POTS): Secondary | ICD-10-CM | POA: Diagnosis not present

## 2023-01-12 MED ORDER — FLUTICASONE PROPIONATE HFA 110 MCG/ACT IN AERO
2.0000 | INHALATION_SPRAY | Freq: Two times a day (BID) | RESPIRATORY_TRACT | 1 refills | Status: AC
  Filled 2023-01-12: qty 12, 30d supply, fill #0

## 2023-01-13 ENCOUNTER — Other Ambulatory Visit: Payer: Self-pay

## 2023-01-15 DIAGNOSIS — F900 Attention-deficit hyperactivity disorder, predominantly inattentive type: Secondary | ICD-10-CM | POA: Diagnosis not present

## 2023-01-15 DIAGNOSIS — F339 Major depressive disorder, recurrent, unspecified: Secondary | ICD-10-CM | POA: Diagnosis not present

## 2023-01-18 ENCOUNTER — Other Ambulatory Visit (HOSPITAL_COMMUNITY): Payer: Self-pay

## 2023-01-18 MED ORDER — METHYLPHENIDATE HCL ER (OSM) 54 MG PO TBCR
54.0000 mg | EXTENDED_RELEASE_TABLET | Freq: Every day | ORAL | 0 refills | Status: AC
  Filled 2023-01-18 – 2023-02-02 (×2): qty 30, 30d supply, fill #0

## 2023-01-19 ENCOUNTER — Other Ambulatory Visit (HOSPITAL_COMMUNITY): Payer: Self-pay

## 2023-01-27 ENCOUNTER — Other Ambulatory Visit (HOSPITAL_COMMUNITY): Payer: Self-pay

## 2023-01-29 ENCOUNTER — Other Ambulatory Visit (HOSPITAL_COMMUNITY): Payer: Self-pay

## 2023-02-02 ENCOUNTER — Other Ambulatory Visit (HOSPITAL_COMMUNITY): Payer: Self-pay

## 2023-02-04 ENCOUNTER — Other Ambulatory Visit (HOSPITAL_COMMUNITY): Payer: Self-pay

## 2023-02-04 MED ORDER — SODIUM FLUORIDE 5000 PPM 1.1 % DT PSTE
1.0000 | PASTE | Freq: Two times a day (BID) | DENTAL | 0 refills | Status: AC
  Filled 2023-02-04: qty 100, 30d supply, fill #0

## 2023-02-12 DIAGNOSIS — F339 Major depressive disorder, recurrent, unspecified: Secondary | ICD-10-CM | POA: Diagnosis not present

## 2023-02-12 DIAGNOSIS — F900 Attention-deficit hyperactivity disorder, predominantly inattentive type: Secondary | ICD-10-CM | POA: Diagnosis not present

## 2023-02-16 ENCOUNTER — Other Ambulatory Visit (HOSPITAL_COMMUNITY): Payer: Self-pay

## 2023-02-16 DIAGNOSIS — E559 Vitamin D deficiency, unspecified: Secondary | ICD-10-CM | POA: Diagnosis not present

## 2023-02-16 DIAGNOSIS — F329 Major depressive disorder, single episode, unspecified: Secondary | ICD-10-CM | POA: Diagnosis not present

## 2023-02-16 DIAGNOSIS — J309 Allergic rhinitis, unspecified: Secondary | ICD-10-CM | POA: Diagnosis not present

## 2023-02-16 DIAGNOSIS — F909 Attention-deficit hyperactivity disorder, unspecified type: Secondary | ICD-10-CM | POA: Diagnosis not present

## 2023-02-16 DIAGNOSIS — J45909 Unspecified asthma, uncomplicated: Secondary | ICD-10-CM | POA: Diagnosis not present

## 2023-02-16 MED ORDER — PREDNISONE 20 MG PO TABS
ORAL_TABLET | ORAL | 0 refills | Status: AC
  Filled 2023-02-16: qty 13, 8d supply, fill #0

## 2023-02-26 DIAGNOSIS — F339 Major depressive disorder, recurrent, unspecified: Secondary | ICD-10-CM | POA: Diagnosis not present

## 2023-02-26 DIAGNOSIS — F900 Attention-deficit hyperactivity disorder, predominantly inattentive type: Secondary | ICD-10-CM | POA: Diagnosis not present

## 2023-03-19 ENCOUNTER — Other Ambulatory Visit (HOSPITAL_COMMUNITY): Payer: Self-pay

## 2023-03-19 MED ORDER — METHYLPHENIDATE HCL ER (OSM) 54 MG PO TBCR
54.0000 mg | EXTENDED_RELEASE_TABLET | Freq: Every morning | ORAL | 0 refills | Status: AC
  Filled 2023-03-19: qty 30, 30d supply, fill #0

## 2023-03-23 ENCOUNTER — Other Ambulatory Visit (HOSPITAL_COMMUNITY): Payer: Self-pay

## 2023-03-23 ENCOUNTER — Other Ambulatory Visit: Payer: Self-pay

## 2023-03-23 DIAGNOSIS — J3081 Allergic rhinitis due to animal (cat) (dog) hair and dander: Secondary | ICD-10-CM | POA: Diagnosis not present

## 2023-03-23 DIAGNOSIS — R052 Subacute cough: Secondary | ICD-10-CM | POA: Diagnosis not present

## 2023-03-23 DIAGNOSIS — J45991 Cough variant asthma: Secondary | ICD-10-CM | POA: Diagnosis not present

## 2023-03-23 DIAGNOSIS — J301 Allergic rhinitis due to pollen: Secondary | ICD-10-CM | POA: Diagnosis not present

## 2023-03-23 MED ORDER — AZELASTINE-FLUTICASONE 137-50 MCG/ACT NA SUSP
1.0000 | Freq: Two times a day (BID) | NASAL | 5 refills | Status: AC
  Filled 2023-03-23: qty 23, 30d supply, fill #0
  Filled 2023-03-25: qty 23, 15d supply, fill #0

## 2023-03-23 MED ORDER — BUDESONIDE-FORMOTEROL FUMARATE 80-4.5 MCG/ACT IN AERO
2.0000 | INHALATION_SPRAY | Freq: Two times a day (BID) | RESPIRATORY_TRACT | 5 refills | Status: AC
  Filled 2023-03-23 (×2): qty 10.2, 30d supply, fill #0
  Filled 2023-12-24: qty 10.2, 30d supply, fill #1

## 2023-03-23 MED ORDER — FEXOFENADINE HCL 180 MG PO TABS
180.0000 mg | ORAL_TABLET | Freq: Every day | ORAL | 5 refills | Status: AC
  Filled 2023-03-23: qty 30, 30d supply, fill #0

## 2023-03-23 MED ORDER — ALBUTEROL SULFATE HFA 108 (90 BASE) MCG/ACT IN AERS
2.0000 | INHALATION_SPRAY | RESPIRATORY_TRACT | 0 refills | Status: AC | PRN
  Filled 2023-03-23: qty 6.7, 25d supply, fill #0

## 2023-03-25 ENCOUNTER — Other Ambulatory Visit: Payer: Self-pay | Admitting: Allergy and Immunology

## 2023-03-25 ENCOUNTER — Ambulatory Visit
Admission: RE | Admit: 2023-03-25 | Discharge: 2023-03-25 | Disposition: A | Discharge status: Home / Self Care | Payer: Commercial Managed Care - PPO | Source: Ambulatory Visit | Attending: Allergy and Immunology | Admitting: Allergy and Immunology

## 2023-03-25 ENCOUNTER — Other Ambulatory Visit (HOSPITAL_COMMUNITY): Payer: Self-pay

## 2023-03-25 DIAGNOSIS — J45991 Cough variant asthma: Secondary | ICD-10-CM

## 2023-03-25 DIAGNOSIS — J45909 Unspecified asthma, uncomplicated: Secondary | ICD-10-CM | POA: Diagnosis not present

## 2023-03-26 DIAGNOSIS — F339 Major depressive disorder, recurrent, unspecified: Secondary | ICD-10-CM | POA: Diagnosis not present

## 2023-03-26 DIAGNOSIS — F900 Attention-deficit hyperactivity disorder, predominantly inattentive type: Secondary | ICD-10-CM | POA: Diagnosis not present

## 2023-04-09 DIAGNOSIS — F339 Major depressive disorder, recurrent, unspecified: Secondary | ICD-10-CM | POA: Diagnosis not present

## 2023-04-09 DIAGNOSIS — F900 Attention-deficit hyperactivity disorder, predominantly inattentive type: Secondary | ICD-10-CM | POA: Diagnosis not present

## 2023-04-23 DIAGNOSIS — F339 Major depressive disorder, recurrent, unspecified: Secondary | ICD-10-CM | POA: Diagnosis not present

## 2023-04-23 DIAGNOSIS — F900 Attention-deficit hyperactivity disorder, predominantly inattentive type: Secondary | ICD-10-CM | POA: Diagnosis not present

## 2023-05-03 ENCOUNTER — Institutional Professional Consult (permissible substitution): Payer: 59 | Admitting: Internal Medicine

## 2023-05-21 DIAGNOSIS — F339 Major depressive disorder, recurrent, unspecified: Secondary | ICD-10-CM | POA: Diagnosis not present

## 2023-05-21 DIAGNOSIS — F900 Attention-deficit hyperactivity disorder, predominantly inattentive type: Secondary | ICD-10-CM | POA: Diagnosis not present

## 2023-05-26 ENCOUNTER — Other Ambulatory Visit (HOSPITAL_COMMUNITY): Payer: Self-pay

## 2023-05-26 ENCOUNTER — Other Ambulatory Visit: Payer: Self-pay

## 2023-05-26 DIAGNOSIS — J309 Allergic rhinitis, unspecified: Secondary | ICD-10-CM | POA: Diagnosis not present

## 2023-05-26 DIAGNOSIS — F909 Attention-deficit hyperactivity disorder, unspecified type: Secondary | ICD-10-CM | POA: Diagnosis not present

## 2023-05-26 DIAGNOSIS — Z0000 Encounter for general adult medical examination without abnormal findings: Secondary | ICD-10-CM | POA: Diagnosis not present

## 2023-05-26 DIAGNOSIS — F411 Generalized anxiety disorder: Secondary | ICD-10-CM | POA: Diagnosis not present

## 2023-05-26 DIAGNOSIS — Z13220 Encounter for screening for lipoid disorders: Secondary | ICD-10-CM | POA: Diagnosis not present

## 2023-05-26 DIAGNOSIS — J45909 Unspecified asthma, uncomplicated: Secondary | ICD-10-CM | POA: Diagnosis not present

## 2023-05-26 DIAGNOSIS — E559 Vitamin D deficiency, unspecified: Secondary | ICD-10-CM | POA: Diagnosis not present

## 2023-05-26 DIAGNOSIS — F329 Major depressive disorder, single episode, unspecified: Secondary | ICD-10-CM | POA: Diagnosis not present

## 2023-05-26 DIAGNOSIS — Z124 Encounter for screening for malignant neoplasm of cervix: Secondary | ICD-10-CM | POA: Diagnosis not present

## 2023-05-26 MED ORDER — METHYLPHENIDATE HCL ER 54 MG PO TB24
1.0000 | ORAL_TABLET | Freq: Every morning | ORAL | 0 refills | Status: AC
Start: 2023-06-25 — End: ?
  Filled 2023-08-23: qty 30, 30d supply, fill #0

## 2023-05-26 MED ORDER — METHYLPHENIDATE HCL ER 54 MG PO TB24
1.0000 | ORAL_TABLET | Freq: Every morning | ORAL | 0 refills | Status: AC
  Filled 2023-08-23: qty 30, 30d supply, fill #0

## 2023-05-26 MED ORDER — FLUOXETINE HCL 40 MG PO CAPS
40.0000 mg | ORAL_CAPSULE | Freq: Every day | ORAL | 1 refills | Status: DC
  Filled 2023-05-26 – 2023-06-24 (×2): qty 90, 90d supply, fill #0
  Filled 2023-10-07: qty 90, 90d supply, fill #1

## 2023-05-26 MED ORDER — METHYLPHENIDATE HCL ER (OSM) 54 MG PO TBCR
54.0000 mg | EXTENDED_RELEASE_TABLET | Freq: Every morning | ORAL | 0 refills | Status: AC
  Filled 2023-05-26 – 2023-06-24 (×2): qty 30, 30d supply, fill #0

## 2023-06-04 DIAGNOSIS — F900 Attention-deficit hyperactivity disorder, predominantly inattentive type: Secondary | ICD-10-CM | POA: Diagnosis not present

## 2023-06-04 DIAGNOSIS — F339 Major depressive disorder, recurrent, unspecified: Secondary | ICD-10-CM | POA: Diagnosis not present

## 2023-06-07 ENCOUNTER — Other Ambulatory Visit (HOSPITAL_COMMUNITY): Payer: Self-pay

## 2023-06-24 ENCOUNTER — Other Ambulatory Visit (HOSPITAL_COMMUNITY): Payer: Self-pay

## 2023-06-24 ENCOUNTER — Other Ambulatory Visit: Payer: Self-pay

## 2023-07-02 DIAGNOSIS — F900 Attention-deficit hyperactivity disorder, predominantly inattentive type: Secondary | ICD-10-CM | POA: Diagnosis not present

## 2023-07-02 DIAGNOSIS — F339 Major depressive disorder, recurrent, unspecified: Secondary | ICD-10-CM | POA: Diagnosis not present

## 2023-07-16 DIAGNOSIS — F900 Attention-deficit hyperactivity disorder, predominantly inattentive type: Secondary | ICD-10-CM | POA: Diagnosis not present

## 2023-07-16 DIAGNOSIS — F339 Major depressive disorder, recurrent, unspecified: Secondary | ICD-10-CM | POA: Diagnosis not present

## 2023-08-13 DIAGNOSIS — F339 Major depressive disorder, recurrent, unspecified: Secondary | ICD-10-CM | POA: Diagnosis not present

## 2023-08-13 DIAGNOSIS — F900 Attention-deficit hyperactivity disorder, predominantly inattentive type: Secondary | ICD-10-CM | POA: Diagnosis not present

## 2023-08-23 ENCOUNTER — Other Ambulatory Visit (HOSPITAL_COMMUNITY): Payer: Self-pay

## 2023-08-24 ENCOUNTER — Other Ambulatory Visit (HOSPITAL_COMMUNITY): Payer: Self-pay

## 2023-08-24 MED ORDER — METHYLPHENIDATE HCL ER (OSM) 54 MG PO TBCR
54.0000 mg | EXTENDED_RELEASE_TABLET | Freq: Every morning | ORAL | 0 refills | Status: AC
  Filled 2023-10-07: qty 30, 30d supply, fill #0

## 2023-08-24 MED ORDER — METHYLPHENIDATE HCL ER (OSM) 54 MG PO TBCR
54.0000 mg | EXTENDED_RELEASE_TABLET | Freq: Every morning | ORAL | 0 refills | Status: AC
  Filled 2023-11-08: qty 30, 30d supply, fill #0

## 2023-08-24 MED ORDER — METHYLPHENIDATE HCL ER (OSM) 54 MG PO TBCR
54.0000 mg | EXTENDED_RELEASE_TABLET | Freq: Every morning | ORAL | 0 refills | Status: AC
  Filled 2023-08-24: qty 30, 30d supply, fill #0

## 2023-08-26 ENCOUNTER — Other Ambulatory Visit (HOSPITAL_COMMUNITY): Payer: Self-pay

## 2023-08-26 ENCOUNTER — Encounter (HOSPITAL_COMMUNITY): Payer: Self-pay

## 2023-09-10 DIAGNOSIS — F339 Major depressive disorder, recurrent, unspecified: Secondary | ICD-10-CM | POA: Diagnosis not present

## 2023-09-10 DIAGNOSIS — F900 Attention-deficit hyperactivity disorder, predominantly inattentive type: Secondary | ICD-10-CM | POA: Diagnosis not present

## 2023-09-15 ENCOUNTER — Other Ambulatory Visit (HOSPITAL_COMMUNITY): Payer: Self-pay

## 2023-09-15 DIAGNOSIS — F410 Panic disorder [episodic paroxysmal anxiety] without agoraphobia: Secondary | ICD-10-CM | POA: Diagnosis not present

## 2023-09-15 DIAGNOSIS — Z79899 Other long term (current) drug therapy: Secondary | ICD-10-CM | POA: Diagnosis not present

## 2023-09-15 DIAGNOSIS — F418 Other specified anxiety disorders: Secondary | ICD-10-CM | POA: Diagnosis not present

## 2023-09-15 MED ORDER — BUSPIRONE HCL 5 MG PO TABS
5.0000 mg | ORAL_TABLET | Freq: Two times a day (BID) | ORAL | 0 refills | Status: AC | PRN
  Filled 2023-09-15: qty 60, 30d supply, fill #0

## 2023-09-24 DIAGNOSIS — F900 Attention-deficit hyperactivity disorder, predominantly inattentive type: Secondary | ICD-10-CM | POA: Diagnosis not present

## 2023-09-24 DIAGNOSIS — F339 Major depressive disorder, recurrent, unspecified: Secondary | ICD-10-CM | POA: Diagnosis not present

## 2023-09-29 DIAGNOSIS — F339 Major depressive disorder, recurrent, unspecified: Secondary | ICD-10-CM | POA: Diagnosis not present

## 2023-09-29 DIAGNOSIS — F900 Attention-deficit hyperactivity disorder, predominantly inattentive type: Secondary | ICD-10-CM | POA: Diagnosis not present

## 2023-10-07 ENCOUNTER — Other Ambulatory Visit (HOSPITAL_COMMUNITY): Payer: Self-pay

## 2023-10-08 DIAGNOSIS — F900 Attention-deficit hyperactivity disorder, predominantly inattentive type: Secondary | ICD-10-CM | POA: Diagnosis not present

## 2023-10-08 DIAGNOSIS — F339 Major depressive disorder, recurrent, unspecified: Secondary | ICD-10-CM | POA: Diagnosis not present

## 2023-11-05 DIAGNOSIS — F339 Major depressive disorder, recurrent, unspecified: Secondary | ICD-10-CM | POA: Diagnosis not present

## 2023-11-05 DIAGNOSIS — F900 Attention-deficit hyperactivity disorder, predominantly inattentive type: Secondary | ICD-10-CM | POA: Diagnosis not present

## 2023-11-08 ENCOUNTER — Other Ambulatory Visit (HOSPITAL_COMMUNITY): Payer: Self-pay

## 2023-11-19 DIAGNOSIS — F339 Major depressive disorder, recurrent, unspecified: Secondary | ICD-10-CM | POA: Diagnosis not present

## 2023-11-19 DIAGNOSIS — F900 Attention-deficit hyperactivity disorder, predominantly inattentive type: Secondary | ICD-10-CM | POA: Diagnosis not present

## 2023-11-26 DIAGNOSIS — F411 Generalized anxiety disorder: Secondary | ICD-10-CM | POA: Diagnosis not present

## 2023-11-26 DIAGNOSIS — F909 Attention-deficit hyperactivity disorder, unspecified type: Secondary | ICD-10-CM | POA: Diagnosis not present

## 2023-11-26 DIAGNOSIS — E559 Vitamin D deficiency, unspecified: Secondary | ICD-10-CM | POA: Diagnosis not present

## 2023-11-26 DIAGNOSIS — J45909 Unspecified asthma, uncomplicated: Secondary | ICD-10-CM | POA: Diagnosis not present

## 2023-11-26 DIAGNOSIS — F329 Major depressive disorder, single episode, unspecified: Secondary | ICD-10-CM | POA: Diagnosis not present

## 2023-11-26 DIAGNOSIS — J309 Allergic rhinitis, unspecified: Secondary | ICD-10-CM | POA: Diagnosis not present

## 2023-12-03 DIAGNOSIS — F339 Major depressive disorder, recurrent, unspecified: Secondary | ICD-10-CM | POA: Diagnosis not present

## 2023-12-03 DIAGNOSIS — F900 Attention-deficit hyperactivity disorder, predominantly inattentive type: Secondary | ICD-10-CM | POA: Diagnosis not present

## 2023-12-24 ENCOUNTER — Other Ambulatory Visit (HOSPITAL_COMMUNITY): Payer: Self-pay

## 2023-12-24 MED ORDER — BUDESONIDE-FORMOTEROL FUMARATE 80-4.5 MCG/ACT IN AERO
2.0000 | INHALATION_SPRAY | Freq: Two times a day (BID) | RESPIRATORY_TRACT | 5 refills | Status: AC
  Filled 2023-12-24: qty 10.2, 30d supply, fill #0

## 2023-12-27 ENCOUNTER — Other Ambulatory Visit (HOSPITAL_COMMUNITY): Payer: Self-pay

## 2023-12-28 ENCOUNTER — Other Ambulatory Visit (HOSPITAL_COMMUNITY): Payer: Self-pay

## 2023-12-28 MED ORDER — METHYLPHENIDATE HCL ER (OSM) 54 MG PO TBCR
54.0000 mg | EXTENDED_RELEASE_TABLET | Freq: Every morning | ORAL | 0 refills | Status: AC
  Filled 2024-01-25 – 2024-03-06 (×3): qty 30, 30d supply, fill #0

## 2023-12-28 MED ORDER — METHYLPHENIDATE HCL ER (OSM) 54 MG PO TBCR: 54.0000 mg | EXTENDED_RELEASE_TABLET | Freq: Every morning | ORAL | 0 refills | Status: AC

## 2023-12-28 MED ORDER — METHYLPHENIDATE HCL ER (OSM) 54 MG PO TBCR
54.0000 mg | EXTENDED_RELEASE_TABLET | Freq: Every morning | ORAL | 0 refills | Status: AC
  Filled 2023-12-28: qty 30, 30d supply, fill #0

## 2023-12-30 ENCOUNTER — Other Ambulatory Visit (HOSPITAL_COMMUNITY): Payer: Self-pay

## 2023-12-30 ENCOUNTER — Encounter (HOSPITAL_COMMUNITY): Payer: Self-pay

## 2023-12-31 DIAGNOSIS — F900 Attention-deficit hyperactivity disorder, predominantly inattentive type: Secondary | ICD-10-CM | POA: Diagnosis not present

## 2023-12-31 DIAGNOSIS — F339 Major depressive disorder, recurrent, unspecified: Secondary | ICD-10-CM | POA: Diagnosis not present

## 2024-01-14 DIAGNOSIS — F900 Attention-deficit hyperactivity disorder, predominantly inattentive type: Secondary | ICD-10-CM | POA: Diagnosis not present

## 2024-01-14 DIAGNOSIS — F339 Major depressive disorder, recurrent, unspecified: Secondary | ICD-10-CM | POA: Diagnosis not present

## 2024-01-25 ENCOUNTER — Other Ambulatory Visit: Payer: Self-pay

## 2024-01-25 ENCOUNTER — Encounter (HOSPITAL_COMMUNITY): Payer: Self-pay

## 2024-01-25 ENCOUNTER — Other Ambulatory Visit (HOSPITAL_COMMUNITY): Payer: Self-pay

## 2024-01-28 ENCOUNTER — Other Ambulatory Visit: Payer: Self-pay

## 2024-01-28 ENCOUNTER — Other Ambulatory Visit (HOSPITAL_COMMUNITY): Payer: Self-pay

## 2024-01-31 ENCOUNTER — Other Ambulatory Visit (HOSPITAL_COMMUNITY): Payer: Self-pay

## 2024-01-31 MED ORDER — FLUOXETINE HCL 40 MG PO CAPS
40.0000 mg | ORAL_CAPSULE | Freq: Every day | ORAL | 1 refills | Status: AC
  Filled 2024-01-31 – 2024-03-06 (×2): qty 90, 90d supply, fill #0

## 2024-02-08 ENCOUNTER — Other Ambulatory Visit (HOSPITAL_COMMUNITY): Payer: Self-pay

## 2024-02-10 ENCOUNTER — Other Ambulatory Visit (HOSPITAL_COMMUNITY): Payer: Self-pay

## 2024-03-06 ENCOUNTER — Other Ambulatory Visit (HOSPITAL_COMMUNITY): Payer: Self-pay

## 2024-03-08 ENCOUNTER — Other Ambulatory Visit (HOSPITAL_COMMUNITY): Payer: Self-pay
